# Patient Record
Sex: Female | Born: 1985 | Race: White | Hispanic: No | Marital: Single | Smoking: Current every day smoker
Health system: Southern US, Community
[De-identification: ages and names within clinical notes are randomized; demographics above are authoritative.]

## PROBLEM LIST (undated history)

## (undated) DIAGNOSIS — K7010 Alcoholic hepatitis without ascites: Secondary | ICD-10-CM

## (undated) DIAGNOSIS — F1090 Alcohol use, unspecified, uncomplicated: Secondary | ICD-10-CM

## (undated) HISTORY — DX: Alcoholic hepatitis without ascites: K70.10

## (undated) HISTORY — PX: NO PAST SURGERIES: SHX2092

---
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
## 2012-01-31 ENCOUNTER — Encounter (HOSPITAL_COMMUNITY): Payer: Self-pay | Admitting: *Deleted

## 2012-02-14 ENCOUNTER — Encounter (HOSPITAL_COMMUNITY): Admission: RE | Payer: Self-pay | Source: Ambulatory Visit

## 2012-02-14 ENCOUNTER — Ambulatory Visit (HOSPITAL_COMMUNITY): Admission: RE | Admit: 2012-02-14 | Payer: Self-pay | Source: Ambulatory Visit | Admitting: Obstetrics & Gynecology

## 2012-02-14 SURGERY — ESSURE TUBAL STERILIZATION
Anesthesia: Choice | Laterality: Bilateral

## 2017-09-13 ENCOUNTER — Emergency Department (HOSPITAL_COMMUNITY)
Admission: EM | Admit: 2017-09-13 | Discharge: 2017-09-13 | Disposition: A | Discharge status: Home / Self Care | Payer: Self-pay | Attending: Emergency Medicine | Admitting: Emergency Medicine

## 2017-09-13 ENCOUNTER — Other Ambulatory Visit: Payer: Self-pay

## 2017-09-13 ENCOUNTER — Encounter (HOSPITAL_COMMUNITY): Payer: Self-pay | Admitting: Emergency Medicine

## 2017-09-13 DIAGNOSIS — Y92008 Other place in unspecified non-institutional (private) residence as the place of occurrence of the external cause: Secondary | ICD-10-CM | POA: Insufficient documentation

## 2017-09-13 DIAGNOSIS — W108XXA Fall (on) (from) other stairs and steps, initial encounter: Secondary | ICD-10-CM | POA: Insufficient documentation

## 2017-09-13 DIAGNOSIS — Z23 Encounter for immunization: Secondary | ICD-10-CM | POA: Insufficient documentation

## 2017-09-13 DIAGNOSIS — F17210 Nicotine dependence, cigarettes, uncomplicated: Secondary | ICD-10-CM | POA: Insufficient documentation

## 2017-09-13 DIAGNOSIS — W19XXXA Unspecified fall, initial encounter: Secondary | ICD-10-CM

## 2017-09-13 DIAGNOSIS — Y998 Other external cause status: Secondary | ICD-10-CM | POA: Insufficient documentation

## 2017-09-13 DIAGNOSIS — Y9389 Activity, other specified: Secondary | ICD-10-CM | POA: Insufficient documentation

## 2017-09-13 DIAGNOSIS — S01511A Laceration without foreign body of lip, initial encounter: Secondary | ICD-10-CM | POA: Insufficient documentation

## 2017-09-13 MED ORDER — LIDOCAINE-EPINEPHRINE (PF) 2 %-1:200000 IJ SOLN
10.0000 mL | Freq: Once | INTRAMUSCULAR | Status: AC
  Administered 2017-09-13: 10 mL via INTRADERMAL
  Filled 2017-09-13: qty 20

## 2017-09-13 MED ORDER — TETANUS-DIPHTH-ACELL PERTUSSIS 5-2.5-18.5 LF-MCG/0.5 IM SUSP
0.5000 mL | Freq: Once | INTRAMUSCULAR | Status: AC
  Administered 2017-09-13: 0.5 mL via INTRAMUSCULAR
  Filled 2017-09-13: qty 0.5

## 2017-09-13 NOTE — Discharge Instructions (Addendum)
Apply triple antibiotic ointment 2-3 times a day.  Follow-up for suture removal in 3 to 4 days.  possible that he may need to see return if worsening headache, nausea, vomiting, confusion, memory loss, any new concerning symptoms to suggest a head injury.

## 2017-09-13 NOTE — ED Triage Notes (Signed)
C/o left upper lip laceration.  States she fell down approx 8 steps while kissing.  Denies LOC.  Denies neck and back pain.  Ambulatory to triage.  Reports etoh.

## 2017-09-13 NOTE — ED Provider Notes (Signed)
[facility] EMERGENCY DEPARTMENT Provider Note   CSN: [id number] Arrival date & time: 09/13/17  0505     History   Chief Complaint Chief Complaint  Patient presents with  . Lip Laceration    HPI Tanya Goodman is a 32 y.o. female.  HPI Tanya Goodman is a 32 y.o. female presents to emergency department complaining of left upper lip laceration.  Patient states kissing her boyfriend when they lost balance and both fell down approximately 8 stairs.  She states she did hit her head but denies loss of consciousness or headache.  She denies any other injuries other than hitting her mouth on the railing as she was falling.  She sustained a laceration to the left upper lip.  She states some pain to her teeth, however no loosened or broken teeth.  Denies any pain with opening and closing her mouth.  Denies any neck pain.  No numbness or weakness in extremities.  No back pain.  No chest or abdominal pain.  She is unsure when her last tetanus was.  No treatment prior to coming in.  Past Medical History:  Diagnosis Date  . SVD (spontaneous vaginal delivery)    x 3    There are no active problems to display for this patient.   Past Surgical History:  Procedure Laterality Date  . NO PAST SURGERIES       OB History   None      Home Medications    Prior to Admission medications   Not on File    Family History No family history on file.  Social History Social History   Tobacco Use  . Smoking status: Current Every Day Smoker    Packs/day: 0.25    Years: 5.00    Pack years: 1.25    Types: Cigarettes  . Smokeless tobacco: Never Used  Substance Use Topics  . Alcohol use: Yes  . Drug use: Yes    Types: Marijuana     Allergies   Patient has no allergy information on record.   Review of Systems Review of Systems  Constitutional: Negative for chills and fever.  HENT: Positive for facial swelling. Negative for dental problem.   Respiratory:  Negative for cough, chest tightness and shortness of breath.   Cardiovascular: Negative for chest pain, palpitations and leg swelling.  Musculoskeletal: Negative for arthralgias, myalgias, neck pain and neck stiffness.  Skin: Positive for wound. Negative for rash.  Neurological: Negative for dizziness, weakness and headaches.  All other systems reviewed and are negative.    Physical Exam Updated Vital Signs BP 126/84 (BP Location: Right Arm)   Pulse 88   Temp 99.1 F (37.3 C) (Oral)   Resp 14   LMP 08/13/2017   SpO2 100%   Physical Exam  Constitutional: She is oriented to person, place, and time. She appears well-developed and well-nourished. No distress.  HENT:  Head: Normocephalic.  Left upper lip laceration through vermilion border, irregular, with some missing tissue. Teeth normal, with no fractures or loosening. No TTP over maxilla or mandible.   Eyes: Pupils are equal, round, and reactive to light. Conjunctivae and EOM are normal.  Neck: Neck supple.  Cardiovascular: Normal rate, regular rhythm and normal heart sounds.  Pulmonary/Chest: Effort normal and breath sounds normal. No respiratory distress. She has no wheezes. She has no rales.  Musculoskeletal: She exhibits no edema.  Neurological: She is alert and oriented to person, place, and time.  Skin: Skin is warm  and dry.  Psychiatric: She has a normal mood and affect. Her behavior is normal.  Nursing note and vitals reviewed.    ED Treatments / Results  Labs (all labs ordered are listed, but only abnormal results are displayed) Labs Reviewed - No data to display  EKG None  Radiology No results found.  Procedures .Marland KitchenLaceration Repair Date/Time: 09/13/2017 8:41 AM Performed by: Jaynie Crumble, PA-C Authorized by: Jaynie Crumble, PA-C   Consent:    Consent obtained:  Verbal   Consent given by:  Patient   Risks discussed:  Infection, pain, poor cosmetic result, need for additional repair, nerve  damage and poor wound healing   Alternatives discussed:  No treatment Anesthesia (see MAR for exact dosages):    Anesthesia method:  Nerve block   Block location:  Superior alveolar   Block needle gauge:  25 G   Block anesthetic:  Lidocaine 2% WITH epi   Block injection procedure:  Anatomic landmarks identified, introduced needle, negative aspiration for blood and anatomic landmarks palpated   Block outcome:  Anesthesia achieved Laceration details:    Location:  Lip   Lip location:  Upper lip, full thickness   Vermilion border involved: yes     Length (cm):  2 Repair type:    Repair type:  Intermediate Pre-procedure details:    Preparation:  Patient was prepped and draped in usual sterile fashion Treatment:    Area cleansed with:  Saline and Betadine   Amount of cleaning:  Standard   Irrigation solution:  Sterile saline   Irrigation method:  Syringe Mucous membrane repair:    Suture size:  6-0   Suture material:  Fast-absorbing gut   Suture technique:  Simple interrupted   Number of sutures:  3 Skin repair:    Repair method:  Sutures   Suture size:  6-0   Suture material:  Prolene   Suture technique:  Simple interrupted   Number of sutures:  4 Approximation:    Approximation:  Close   Vermilion border: well-aligned   Post-procedure details:    Patient tolerance of procedure:  Tolerated well, no immediate complications   (including critical care time)  Medications Ordered in ED Medications  lidocaine-EPINEPHrine (XYLOCAINE W/EPI) 2 %-1:200000 (PF) injection 10 mL (has no administration in time range)  Tdap (BOOSTRIX) injection 0.5 mL (has no administration in time range)     Initial Impression / Assessment and Plan / ED Course  I have reviewed the triage vital signs and the nursing notes.  Pertinent labs & imaging results that were available during my care of the patient were reviewed by me and considered in my medical decision making (see chart for details).      Patient with a head injury after falling down  8 steps.  Incident happened yesterday evening.  Exam with no concerning findings other than laceration of the lip.  Specifically no loss of consciousness, no nausea or vomiting, dizziness no confusion, I do not think patient needs any imaging of her head.  She is not anticoagulated.  Teeth are intact.  No facial bony tenderness.  Laceration repaired with sutures.  Laceration is complex, it is irregular, through vermilion border, with some tissue missing.  I was able to pull the skin together and alignment of to best of my ability.  We discussed that patient may need to follow-up with plastic surgery in the future if it scars badly.  For now bacitracin, follow-up in 3 to 5 days for suture removal.  Return precautions discussed.  Vitals:   09/13/17 0513 09/13/17 0647  BP: 130/85 126/84  Pulse: (!) 102 88  Resp: 18 14  Temp: 99.1 F (37.3 C)   TempSrc: Oral   SpO2: 98% 100%    Final Clinical Impressions(s) / ED Diagnoses   Final diagnoses:  Lip laceration, initial encounter  Fall, initial encounter    ED Discharge Orders    None       Jaynie CrumbleKirichenko, , PA-C 09/13/17 0845    Gwyneth SproutPlunkett, Whitney, MD 09/14/17 1544

## 2017-09-16 ENCOUNTER — Encounter (HOSPITAL_COMMUNITY): Payer: Self-pay | Admitting: *Deleted

## 2017-09-16 ENCOUNTER — Emergency Department (HOSPITAL_COMMUNITY)
Admission: EM | Admit: 2017-09-16 | Discharge: 2017-09-16 | Disposition: A | Discharge status: Home / Self Care | Payer: Self-pay | Attending: Emergency Medicine | Admitting: Emergency Medicine

## 2017-09-16 ENCOUNTER — Other Ambulatory Visit: Payer: Self-pay

## 2017-09-16 DIAGNOSIS — Z4802 Encounter for removal of sutures: Secondary | ICD-10-CM | POA: Insufficient documentation

## 2017-09-16 DIAGNOSIS — F17210 Nicotine dependence, cigarettes, uncomplicated: Secondary | ICD-10-CM | POA: Insufficient documentation

## 2017-09-16 NOTE — ED Triage Notes (Signed)
Pt in for suture removal, placed on Saturday morning

## 2017-09-16 NOTE — ED Provider Notes (Signed)
[facility] EMERGENCY DEPARTMENT Provider Note   CSN: [id number] Arrival date & time: 09/16/17  1221     History   Chief Complaint Chief Complaint  Patient presents with  . Suture / Staple Removal    HPI Tanya Goodman is a 32 y.o. female.  HPI   32 year old female presents today for suture removal.  Laceration on 09/13/2017 that was repaired here in the emergency room.  She had 3 fast-absorbing sutures placed on the mucous membrane followed by four 6-0 Prolene's on the upper lip.  She notes normal healing with no complications.  No other complaints.  Past Medical History:  Diagnosis Date  . SVD (spontaneous vaginal delivery)    x 3    There are no active problems to display for this patient.   Past Surgical History:  Procedure Laterality Date  . NO PAST SURGERIES       OB History   None      Home Medications    Prior to Admission medications   Not on File    Family History History reviewed. No pertinent family history.  Social History Social History   Tobacco Use  . Smoking status: Current Every Day Smoker    Packs/day: 0.25    Years: 5.00    Pack years: 1.25    Types: Cigarettes  . Smokeless tobacco: Never Used  Substance Use Topics  . Alcohol use: Yes  . Drug use: Yes    Types: Marijuana     Allergies   Patient has no known allergies.   Review of Systems Review of Systems  All other systems reviewed and are negative.    Physical Exam Updated Vital Signs BP (!) 164/98 (BP Location: Right Arm)   Pulse 99   Temp 98.4 F (36.9 C) (Oral)   Resp 18   Ht 5\' 4"  (1.626 m)   Wt 86.2 kg (190 lb)   SpO2 99%   BMI 32.61 kg/m   Physical Exam  Constitutional: She is oriented to person, place, and time. She appears well-developed and well-nourished.  HENT:  Head: Normocephalic.  Well-healing laceration to the left upper lip, dissolvable stitches still in place, 4 Prolene in place-no significant edema or discharge  noted, no surrounding redness  Eyes: Pupils are equal, round, and reactive to light. Conjunctivae are normal. Right eye exhibits no discharge. Left eye exhibits no discharge. No scleral icterus.  Neck: Normal range of motion. No JVD present. No tracheal deviation present.  Pulmonary/Chest: Effort normal. No stridor.  Neurological: She is alert and oriented to person, place, and time. Coordination normal.  Psychiatric: She has a normal mood and affect. Her behavior is normal. Judgment and thought content normal.  Nursing note and vitals reviewed.    ED Treatments / Results  Labs (all labs ordered are listed, but only abnormal results are displayed) Labs Reviewed - No data to display  EKG None  Radiology No results found.  Procedures Procedures (including critical care time)  Medications Ordered in ED Medications - No data to display   Initial Impression / Assessment and Plan / ED Course  I have reviewed the triage vital signs and the nursing notes.  Pertinent labs & imaging results that were available during my care of the patient were reviewed by me and considered in my medical decision making (see chart for details).     Labs:   Imaging:  Consults:  Therapeutics:  Discharge Meds:   Assessment/Plan: 32 year old female presents today for  wound assessment and suture removal.  She did have 2 sutures that were amenable to removal at this time.  The lower 2 stitches in her lip would most likely be okay for removal at this time but with benefit from additionalneed  2 to 3 days.  Patient need these removed in 2-3 days.,  She is given strict return precautions, she verbalized understanding and agreement to today's plan.    Final Clinical Impressions(s) / ED Diagnoses   Final diagnoses:  Visit for suture removal    ED Discharge Orders    None       Rosalio LoudHedges, , PA-C 09/16/17 1332    Tilden Fossaees, Elizabeth, MD 09/17/17 1331

## 2017-09-16 NOTE — Discharge Instructions (Addendum)
Please read attached information. If you experience any new or worsening signs or symptoms please return to the emergency room for evaluation. Please follow-up with your primary care provider or specialist as discussed.  °

## 2017-09-16 NOTE — ED Notes (Signed)
Patient verbalized understanding of discharge instructions and denies any further needs or questions at this time. VS stable. Patient ambulatory with steady gait.  

## 2022-07-09 ENCOUNTER — Encounter (HOSPITAL_COMMUNITY): Payer: Self-pay | Admitting: Behavioral Health

## 2022-07-09 ENCOUNTER — Other Ambulatory Visit (HOSPITAL_COMMUNITY)
Admission: EM | Admit: 2022-07-09 | Discharge: 2022-07-13 | Disposition: A | Discharge status: Home / Self Care | Payer: No Payment, Other | Attending: Psychiatry | Admitting: Psychiatry

## 2022-07-09 DIAGNOSIS — F1090 Alcohol use, unspecified, uncomplicated: Secondary | ICD-10-CM | POA: Diagnosis present

## 2022-07-09 DIAGNOSIS — F419 Anxiety disorder, unspecified: Secondary | ICD-10-CM | POA: Diagnosis not present

## 2022-07-09 DIAGNOSIS — Z1152 Encounter for screening for COVID-19: Secondary | ICD-10-CM | POA: Insufficient documentation

## 2022-07-09 LAB — CBC WITH DIFFERENTIAL/PLATELET
Abs Immature Granulocytes: 0.01 10*3/uL (ref 0.00–0.07)
Basophils Absolute: 0 10*3/uL (ref 0.0–0.1)
Basophils Relative: 1 %
Eosinophils Absolute: 0 10*3/uL (ref 0.0–0.5)
Eosinophils Relative: 1 %
HCT: 41 % (ref 36.0–46.0)
Hemoglobin: 14.1 g/dL (ref 12.0–15.0)
Immature Granulocytes: 0 %
Lymphocytes Relative: 51 %
Lymphs Abs: 2.3 10*3/uL (ref 0.7–4.0)
MCH: 35 pg — ABNORMAL HIGH (ref 26.0–34.0)
MCHC: 34.4 g/dL (ref 30.0–36.0)
MCV: 101.7 fL — ABNORMAL HIGH (ref 80.0–100.0)
Monocytes Absolute: 0.4 10*3/uL (ref 0.1–1.0)
Monocytes Relative: 9 %
Neutro Abs: 1.7 10*3/uL (ref 1.7–7.7)
Neutrophils Relative %: 38 %
Platelets: 213 10*3/uL (ref 150–400)
RBC: 4.03 MIL/uL (ref 3.87–5.11)
RDW: 12.2 % (ref 11.5–15.5)
WBC: 4.5 10*3/uL (ref 4.0–10.5)
nRBC: 0 % (ref 0.0–0.2)

## 2022-07-09 LAB — LIPID PANEL
Cholesterol: 328 mg/dL — ABNORMAL HIGH (ref 0–200)
HDL: >135 mg/dL (ref >40)
Triglycerides: 191 mg/dL — ABNORMAL HIGH (ref <150)
VLDL: 38 mg/dL (ref 0–40)

## 2022-07-09 LAB — TSH: TSH: 1.581 u[IU]/mL (ref 0.350–4.500)

## 2022-07-09 LAB — POC URINE PREG, ED: Preg Test, Ur: NEGATIVE

## 2022-07-09 LAB — POCT URINE DRUG SCREEN - MANUAL ENTRY (I-SCREEN)
POC Amphetamine UR: NOT DETECTED
POC Buprenorphine (BUP): NOT DETECTED
POC Cocaine UR: NOT DETECTED
POC Marijuana UR: POSITIVE — AB
POC Methadone UR: NOT DETECTED
POC Methamphetamine UR: NOT DETECTED
POC Morphine: NOT DETECTED
POC Oxazepam (BZO): POSITIVE — AB
POC Oxycodone UR: NOT DETECTED
POC Secobarbital (BAR): NOT DETECTED

## 2022-07-09 LAB — HEMOGLOBIN A1C
Hgb A1c MFr Bld: 5.2 % (ref 4.8–5.6)
Mean Plasma Glucose: 102.54 mg/dL

## 2022-07-09 LAB — COMPREHENSIVE METABOLIC PANEL
ALT: 177 U/L — ABNORMAL HIGH (ref 0–44)
AST: 342 U/L — ABNORMAL HIGH (ref 15–41)
Albumin: 3.9 g/dL (ref 3.5–5.0)
Alkaline Phosphatase: 92 U/L (ref 38–126)
Anion gap: 20 — ABNORMAL HIGH (ref 5–15)
BUN: 12 mg/dL (ref 6–20)
CO2: 18 mmol/L — ABNORMAL LOW (ref 22–32)
Calcium: 9.6 mg/dL (ref 8.9–10.3)
Chloride: 100 mmol/L (ref 98–111)
Creatinine, Ser: 0.57 mg/dL (ref 0.44–1.00)
GFR, Estimated: >60 mL/min (ref >60)
Glucose, Bld: 199 mg/dL — ABNORMAL HIGH (ref 70–99)
Potassium: 3.7 mmol/L (ref 3.5–5.1)
Sodium: 138 mmol/L (ref 135–145)
Total Bilirubin: 0.7 mg/dL (ref 0.3–1.2)
Total Protein: 6.9 g/dL (ref 6.5–8.1)

## 2022-07-09 LAB — MAGNESIUM: Magnesium: 1.9 mg/dL (ref 1.7–2.4)

## 2022-07-09 LAB — POC SARS CORONAVIRUS 2 AG: SARSCOV2ONAVIRUS 2 AG: NEGATIVE

## 2022-07-09 LAB — ETHANOL: Alcohol, Ethyl (B): 268 mg/dL — ABNORMAL HIGH (ref <10)

## 2022-07-09 LAB — POCT PREGNANCY, URINE: Preg Test, Ur: NEGATIVE

## 2022-07-09 LAB — SARS CORONAVIRUS 2 BY RT PCR: SARS Coronavirus 2 by RT PCR: NEGATIVE

## 2022-07-09 MED ORDER — ACETAMINOPHEN 325 MG PO TABS: 650.0000 mg | ORAL_TABLET | Freq: Four times a day (QID) | ORAL | Status: DC | PRN

## 2022-07-09 MED ORDER — ADULT MULTIVITAMIN W/MINERALS CH
1.0000 | ORAL_TABLET | Freq: Every day | ORAL | Status: DC
  Administered 2022-07-09 – 2022-07-13 (×5): 1 via ORAL
  Filled 2022-07-09 (×5): qty 1

## 2022-07-09 MED ORDER — ONDANSETRON 4 MG PO TBDP
4.0000 mg | ORAL_TABLET | Freq: Four times a day (QID) | ORAL | Status: AC | PRN
  Administered 2022-07-09: 4 mg via ORAL
  Filled 2022-07-09: qty 1

## 2022-07-09 MED ORDER — LOPERAMIDE HCL 2 MG PO CAPS: 2.0000 mg | ORAL_CAPSULE | ORAL | Status: AC | PRN

## 2022-07-09 MED ORDER — TRAZODONE HCL 50 MG PO TABS
50.0000 mg | ORAL_TABLET | Freq: Every evening | ORAL | Status: DC | PRN
  Administered 2022-07-09 – 2022-07-10 (×2): 50 mg via ORAL
  Filled 2022-07-09 (×2): qty 1

## 2022-07-09 MED ORDER — LORAZEPAM 1 MG PO TABS
1.0000 mg | ORAL_TABLET | Freq: Four times a day (QID) | ORAL | Status: AC | PRN
  Administered 2022-07-09: 1 mg via ORAL
  Filled 2022-07-09: qty 1

## 2022-07-09 MED ORDER — LORAZEPAM 1 MG PO TABS
1.0000 mg | ORAL_TABLET | Freq: Three times a day (TID) | ORAL | Status: AC
  Administered 2022-07-11 – 2022-07-12 (×3): 1 mg via ORAL
  Filled 2022-07-09 (×3): qty 1

## 2022-07-09 MED ORDER — MAGNESIUM HYDROXIDE 400 MG/5ML PO SUSP: 30.0000 mL | Freq: Every day | ORAL | Status: DC | PRN

## 2022-07-09 MED ORDER — THIAMINE MONONITRATE 100 MG PO TABS
100.0000 mg | ORAL_TABLET | Freq: Every day | ORAL | Status: DC
  Administered 2022-07-10 – 2022-07-13 (×4): 100 mg via ORAL
  Filled 2022-07-09 (×4): qty 1

## 2022-07-09 MED ORDER — HYDROXYZINE HCL 25 MG PO TABS
25.0000 mg | ORAL_TABLET | Freq: Four times a day (QID) | ORAL | Status: AC | PRN
  Administered 2022-07-11 – 2022-07-12 (×2): 25 mg via ORAL
  Filled 2022-07-09: qty 1
  Filled 2022-07-09: qty 10
  Filled 2022-07-09: qty 1

## 2022-07-09 MED ORDER — THIAMINE HCL 100 MG/ML IJ SOLN
100.0000 mg | Freq: Once | INTRAMUSCULAR | Status: AC
  Administered 2022-07-09: 100 mg via INTRAMUSCULAR
  Filled 2022-07-09: qty 2

## 2022-07-09 MED ORDER — LORAZEPAM 1 MG PO TABS
1.0000 mg | ORAL_TABLET | Freq: Two times a day (BID) | ORAL | Status: AC
  Administered 2022-07-12 – 2022-07-13 (×2): 1 mg via ORAL
  Filled 2022-07-09 (×2): qty 1

## 2022-07-09 MED ORDER — ALUM & MAG HYDROXIDE-SIMETH 200-200-20 MG/5ML PO SUSP: 30.0000 mL | ORAL | Status: DC | PRN

## 2022-07-09 MED ORDER — LORAZEPAM 1 MG PO TABS: 1.0000 mg | ORAL_TABLET | Freq: Every day | ORAL | Status: DC

## 2022-07-09 MED ORDER — LORAZEPAM 1 MG PO TABS
1.0000 mg | ORAL_TABLET | Freq: Four times a day (QID) | ORAL | Status: AC
  Administered 2022-07-09 – 2022-07-11 (×6): 1 mg via ORAL
  Filled 2022-07-09 (×6): qty 1

## 2022-07-09 NOTE — ED Provider Notes (Signed)
Facility Based Crisis Admission H&P  Date: 07/09/22 Patient Name: Tanya Goodman MRN: [id number] Chief Complaint: "I have developed alcoholism"  Diagnoses:  Final diagnoses:  Alcohol use disorder   HPI: Tanya Chuteiffany R Freehling is a 37 y.o. female patient with a reported past psychiatric history of anxiety who presented voluntarily and accompanied by her mother Lance CoonGeorgette and daughter Fredric MareBailey to [facility] for a walk-in assessment seeking alcohol detox. Patient requested for her mother and daughter to remain present during assessment.  Patient assessed face-to-face by this provider and chart reviewed on 07/09/22. On evaluation, Glade Stanfordiffany R Jeralyn Bennettutnam is seated in assessment area in no acute distress. Patient is alert and oriented x4, cooperative and pleasant during assessment. Patient appears intoxicated. Speech is coherent, slurred at times, normal rate and volume. Patient appears disheveled and has an odor of alcohol. Eye contact is fair. Mood is depressed and anxious with congruent affect, tearful at times during assessment. Thought process is coherent with logical thought content. Patient denies current suicidal and homicidal ideations and verbally contracts for safety with this Clinical research associatewriter at this time. Patient reports that she experiences passive suicidal ideations with no plan or intent "sometimes, they are fleeting moments." Patient reports last experiencing passive suicidal ideations today. Patient denies a history of suicide attempts or self-harm. Patient denies past inpatient psychiatric hospitalizations or substance use detox/treatment programs. Patient denies current auditory and visual hallucinations but states she "hears my families voices when I'm sober." Patient denies symptoms of paranoia. Patient is able to converse coherently with goal-directed thoughts and no distractibility or preoccupation. Objectively, there is no evidence of psychosis/mania, delusional thinking, or indication that patient is responding  to internal or external stimuli.   Patient endorses increased sleep and decreased appetite related to her alcohol use. Daughter states patient "sleeps all the time." Patient states [place], that her boyfriend just moved out, and that her daughter stays with her occasionally. Patient denies access to weapons. 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 Patient denies having current outpatient psychiatric services for therapy or medication management in place. Patient denies currently taking any medications. Patient endorses alcohol use that became "regular" 14 years ago. Patient reports drinking 2 "big bottles" of wine and "bootleggers" daily. Patient reports occasionally drinking vodka in addition to the wine and bootleggers. Patient's mother states, "I haven't seen her sober in months." Patient reports her last drink was 3 hours prior to [facility]. Patient endorses marijuana use 1-2x/week. Patient endorses Xanax use that she "gets from my nurse friend." Patient estimates that she uses 1/4 bar of Xanax approximately 3x/week. Patient denies use of other illicit substances. Patient denies a history of seizures or DTs. Patient reports current withdrawal symptoms of nausea. Patient states that she also has experienced hand tremors when not using alcohol. Patient is requesting detox and residential substance use treatment.  Patient offered support and encouragement. Discussed admission to the facility base crisis unit for alcohol use disorder, detox, crisis management, and stabilization. Patient is in agreement with plan of care.  PHQ 2-9:  Flowsheet Row [facility]  Thoughts that you would be better off dead, or of hurting yourself in some way Several days  PHQ-9 Total Score 20       Flowsheet Row ED [facility]  C-SSRS RISK CATEGORY No  Risk        Total Time spent with patient: 45 minutes  Musculoskeletal  Strength & Muscle Tone: within normal limits Gait & Station: normal Patient leans: N/A  Psychiatric Specialty Exam  Presentation General Appearance:  Appropriate for Environment; Casual; Disheveled  Eye Contact: Fair  Speech: Normal Rate; Slurred (Coherent, slurred at times)  Speech Volume: Normal  Handedness: Right   Mood and Affect  Mood: Depressed; Anxious  Affect: Congruent; Depressed; Tearful   Thought Process  Thought Processes: Coherent; Goal Directed  Descriptions of Associations:Intact  Orientation:Full (Time, Place and Person)  Thought Content:Logical    Hallucinations:Hallucinations: None (Patient reports hearing her families voices when she is sober)  Ideas of Reference:None  Suicidal Thoughts:Suicidal Thoughts: No (Passive SI today)  Homicidal Thoughts:Homicidal Thoughts: No   Sensorium  Memory: Immediate Fair; Recent Fair; Remote Fair  Judgment: Impaired  Insight: Poor; Lacking   Executive Functions  Concentration: Fair  Attention Span: Fair  Recall: [employer] of Knowledge: Fair  Language: Fair   Psychomotor Activity  Psychomotor Activity: Psychomotor Activity: Normal   Assets  Assets: Communication Skills; Desire for Improvement; Financial Resources/Insurance; Housing; Physical Health; Resilience; Social Support; Transportation   Sleep  Sleep: Sleep: Good Number of Hours of Sleep: 10   Nutritional Assessment (For OBS and FBC admissions only) Has the patient had a weight loss or gain of 10 pounds or more in the last 3 months?: No Has the patient had a decrease in food intake/or appetite?: No Does the patient have dental problems?: No Does the patient have eating habits or behaviors that may be indicators of an eating disorder including binging or inducing vomiting?: No Has the  patient recently lost weight without trying?: 0 Has the patient been eating poorly because of a decreased appetite?: 0 Malnutrition Screening Tool Score: 0    Physical Exam Vitals and nursing note reviewed.  Constitutional:      General: She is not in acute distress.    Appearance: Normal appearance. She is not ill-appearing.  HENT:     Head: Normocephalic and atraumatic.     Nose: Nose normal.  Eyes:     General:        Right eye: No discharge.        Left eye: No discharge.     Conjunctiva/sclera: Conjunctivae normal.  Cardiovascular:     Rate and Rhythm: Tachycardia present.  Pulmonary:     Effort: Pulmonary effort is normal. No respiratory distress.  Musculoskeletal:        General: Normal range of motion.     Cervical back: Normal range of motion.  Skin:    General: Skin is warm and dry.  Neurological:     General: No focal deficit present.     Mental Status: She is alert and oriented to person, place, and time. Mental status is at baseline.  Psychiatric:        Attention and Perception: Attention and perception normal.        Mood and Affect: Mood is anxious and depressed. Affect is tearful.        Speech: Speech is slurred.        Behavior: Behavior normal. Behavior is cooperative.        Thought Content: Thought content normal.        Cognition and Memory: Cognition and memory normal.     Comments: Speech coherent, slurred at times. Judgment impaired.    Review of Systems  Constitutional: Negative.  HENT: Negative.    Eyes: Negative.   Respiratory: Negative.    Cardiovascular: Negative.   Gastrointestinal: Negative.   Genitourinary: Negative.   Musculoskeletal: Negative.   Skin: Negative.   Neurological: Negative.   Endo/Heme/Allergies: Negative.   Psychiatric/Behavioral:  Positive for depression and substance abuse. Negative for hallucinations, memory loss and suicidal ideas. The patient is nervous/anxious. The patient does not have insomnia.      Blood pressure (!) 123/93, pulse (!) 126, temperature 99.1 F (37.3 C), temperature source Oral, resp. rate 20, SpO2 100 %. There is no height or weight on file to calculate BMI.  Past Psychiatric History: Anxiety  Is the patient at risk to self? No  Has the patient been a risk to self in the past 6 months? No .    Has the patient been a risk to self within the distant past? No   Is the patient a risk to others? No   Has the patient been a risk to others in the past 6 months? No   Has the patient been a risk to others within the distant past? No   Past Medical History:  Past Medical History:  Diagnosis Date   SVD (spontaneous vaginal delivery)    x 3   Family History: None reported  Social History:  Social History   Tobacco Use   Smoking status: Every Day    Packs/day: 0.25    Years: 5.00    Additional pack years: 0.00    Total pack years: 1.25    Types: Cigarettes   Smokeless tobacco: Never  Substance Use Topics   Alcohol use: Yes   Drug use: Yes    Types: Marijuana    Last Labs:  Admission on 07/09/2022  Component Date Value Ref Range Status   WBC 07/09/2022 4.5  4.0 - 10.5 K/uL Final   RBC 07/09/2022 4.03  3.87 - 5.11 MIL/uL Final   Hemoglobin 07/09/2022 14.1  12.0 - 15.0 g/dL Final   HCT 16/12/9602 41.0  36.0 - 46.0 % Final   MCV 07/09/2022 101.7 (H)  80.0 - 100.0 fL Final   MCH 07/09/2022 35.0 (H)  26.0 - 34.0 pg Final   MCHC 07/09/2022 34.4  30.0 - 36.0 g/dL Final   RDW 54/11/8117 12.2  11.5 - 15.5 % Final   Platelets 07/09/2022 213  150 - 400 K/uL Final   nRBC 07/09/2022 0.0  0.0 - 0.2 % Final   Neutrophils Relative % 07/09/2022 38  % Final   Neutro Abs 07/09/2022 1.7  1.7 - 7.7 K/uL Final   Lymphocytes Relative 07/09/2022 51  % Final   Lymphs Abs 07/09/2022 2.3  0.7 - 4.0 K/uL Final   Monocytes Relative 07/09/2022 9  % Final   Monocytes Absolute 07/09/2022 0.4  0.1 - 1.0 K/uL Final   Eosinophils Relative 07/09/2022 1  % Final   Eosinophils  Absolute 07/09/2022 0.0  0.0 - 0.5 K/uL Final   Basophils Relative 07/09/2022 1  % Final   Basophils Absolute 07/09/2022 0.0  0.0 - 0.1 K/uL Final   Immature Granulocytes 07/09/2022 0  % Final   Abs Immature Granulocytes 07/09/2022 0.01  0.00 - 0.07 K/uL Final   Performed at [facility], [address] [place], [place] [address]   Sodium 07/09/2022 138  135 - 145 mmol/L Final   Potassium 07/09/2022 3.7  3.5 - 5.1 mmol/L Final   Chloride 07/09/2022 100  98 - 111 mmol/L Final   CO2 07/09/2022 18 (  L)  22 - 32 mmol/L Final   Glucose, Bld 07/09/2022 199 (H)  70 - 99 mg/dL Final   Glucose reference range applies only to samples taken after fasting for at least 8 hours.   BUN 07/09/2022 12  6 - 20 mg/dL Final   Creatinine, Ser 07/09/2022 0.57  0.44 - 1.00 mg/dL Final   Calcium 96/07/5407 9.6  8.9 - 10.3 mg/dL Final   Total Protein 81/19/1478 6.9  6.5 - 8.1 g/dL Final   Albumin 29/56/2130 3.9  3.5 - 5.0 g/dL Final   AST 86/57/8469 342 (H)  15 - 41 U/L Final   ALT 07/09/2022 177 (H)  0 - 44 U/L Final   Alkaline Phosphatase 07/09/2022 92  38 - 126 U/L Final   Total Bilirubin 07/09/2022 0.7  0.3 - 1.2 mg/dL Final   GFR, Estimated 07/09/2022 >60  >60 mL/min Final   Comment: (NOTE) Calculated using the CKD-EPI Creatinine Equation (2021)    Anion gap 07/09/2022 20 (H)  5 - 15 Final   Comment: ELECTROLYTES REPEATED TO VERIFY Performed at [facility], [address], [place], [place] [address]    Hgb A1c MFr Bld 07/09/2022 5.2  4.8 - 5.6 % Final   Comment: (NOTE) Pre diabetes:          5.7%-6.4%  Diabetes:              >6.4%  Glycemic control for   <7.0% adults with diabetes    Mean Plasma Glucose 07/09/2022 102.54  mg/dL Final   Performed at [facility], [address] [place], [place] [address]   Magnesium 07/09/2022 1.9  1.7 - 2.4 mg/dL Final   Performed at [facility], [address], [place], [place] [address]   Alcohol, Ethyl (B) 07/09/2022 268 (H)  <10  mg/dL Final   Comment: (NOTE) Lowest detectable limit for serum alcohol is 10 mg/dL.  For medical purposes only. Performed at [facility], [address] [place], [place] [address]    Cholesterol 07/09/2022 328 (H)  0 - 200 mg/dL Final   Triglycerides 66/44/0347 191 (H)  <150 mg/dL Final   HDL 42/59/5638 >135  >40 mg/dL Final   Total CHOL/HDL Ratio 07/09/2022 NOT CALCULATED  RATIO Final   VLDL 07/09/2022 38  0 - 40 mg/dL Final   LDL Cholesterol 07/09/2022 NOT CALCULATED  0 - 99 mg/dL Final   Performed at [facility], [address] [place], [place] [address]   TSH 07/09/2022 1.581  0.350 - 4.500 uIU/mL Final   Comment: Performed by a 3rd Generation assay with a functional sensitivity of <=0.01 uIU/mL. Performed at [facility], [address], [place], [place] [address]    Preg Test, Ur 07/09/2022 Negative  Negative Final   POC Amphetamine UR 07/09/2022 None Detected  NONE DETECTED (Cut Off Level 1000 ng/mL) Final   POC Secobarbital (BAR) 07/09/2022 None Detected  NONE DETECTED (Cut Off Level 300 ng/mL) Final   POC Buprenorphine (BUP) 07/09/2022 None Detected  NONE DETECTED (Cut Off Level 10 ng/mL) Final   POC Oxazepam (BZO) 07/09/2022 Positive (A)  NONE DETECTED (Cut Off Level 300 ng/mL) Final   POC Cocaine UR 07/09/2022 None Detected  NONE DETECTED (Cut Off Level 300 ng/mL) Final   POC Methamphetamine UR 07/09/2022 None Detected  NONE DETECTED (Cut Off Level 1000 ng/mL) Final   POC Morphine 07/09/2022 None Detected  NONE DETECTED (Cut Off Level 300 ng/mL) Final  POC Methadone UR 07/09/2022 None Detected  NONE DETECTED (Cut Off Level 300 ng/mL) Final   POC Oxycodone UR 07/09/2022 None Detected  NONE DETECTED (Cut Off Level 100 ng/mL) Final   POC Marijuana UR 07/09/2022 Positive (A)  NONE DETECTED (Cut Off Level 50 ng/mL) Final   SARSCOV2ONAVIRUS 2 AG 07/09/2022 NEGATIVE  NEGATIVE Final   Comment: (NOTE) SARS-CoV-2 antigen NOT DETECTED.   Negative results are  presumptive.  Negative results do not preclude SARS-CoV-2 infection and should not be used as the sole basis for treatment or other patient management decisions, including infection  control decisions, particularly in the presence of clinical signs and  symptoms consistent with COVID-19, or in those who have been in contact with the virus.  Negative results must be combined with clinical observations, patient history, and epidemiological information. The expected result is Negative.  Fact Sheet for Patients: https://www.jennings-kim.com/  Fact Sheet for Healthcare Providers: https://alexander-rogers.biz/  This test is not yet approved or cleared by the [place] FDA and  has been authorized for detection and/or diagnosis of SARS-CoV-2 by FDA under an Emergency Use Authorization (EUA).  This EUA will remain in effect (meaning this test can be used) for the duration of  the COV                          ID-19 declaration under Section 564(b)(1) of the Act, 21 U.S.C. section 360bbb-3(b)(1), unless the authorization is terminated or revoked sooner.     Preg Test, Ur 07/09/2022 NEGATIVE  NEGATIVE Final   Comment:        THE SENSITIVITY OF THIS METHODOLOGY IS >24 mIU/mL     Allergies: Patient has no known allergies.  Medications:  Facility Ordered Medications  Medication   acetaminophen (TYLENOL) tablet 650 mg   alum & mag hydroxide-simeth (MAALOX/MYLANTA) 200-200-20 MG/5ML suspension 30 mL   magnesium hydroxide (MILK OF MAGNESIA) suspension 30 mL   traZODone (DESYREL) tablet 50 mg   [COMPLETED] thiamine (VITAMIN B1) injection 100 mg   [START ON 07/10/2022] thiamine (VITAMIN B1) tablet 100 mg   multivitamin with minerals tablet 1 tablet   LORazepam (ATIVAN) tablet 1 mg   hydrOXYzine (ATARAX) tablet 25 mg   loperamide (IMODIUM) capsule 2-4 mg   ondansetron (ZOFRAN-ODT) disintegrating tablet 4 mg   LORazepam (ATIVAN) tablet 1 mg   Followed by    Melene Muller ON 07/11/2022] LORazepam (ATIVAN) tablet 1 mg   Followed by   Melene Muller ON 07/12/2022] LORazepam (ATIVAN) tablet 1 mg   Followed by   Melene Muller ON 07/14/2022] LORazepam (ATIVAN) tablet 1 mg    Long Term Goals: Improvement in symptoms so as ready for discharge  Short Term Goals: Patient will verbalize feelings in meetings with treatment team members., Patient will attend at least of 50% of the groups daily., Pt will complete the PHQ9 on admission, day 3 and discharge., Patient will participate in completing the [place] Suicide Severity Rating Scale, Patient will score a low risk of violence for 24 hours prior to discharge, and Patient will take medications as prescribed daily.  Medical Decision Making  SANTRICE GRIGNON was admitted to [facility] base crisis unit under the service of Nelly Rout, MD for Alcohol use disorder, crisis management, and stabilization. Routine labs ordered, which include Lab Orders         SARS Coronavirus 2 by RT PCR (hospital order, performed in [facility] hospital lab) *cepheid single result test*  CBC with Differential/Platelet         Comprehensive metabolic panel         Hemoglobin A1c         Magnesium         Ethanol         Lipid panel         TSH         Prolactin         POC urine preg, ED         POCT Urine Drug Screen - (I-Screen)         POC SARS Coronavirus 2 Ag         Pregnancy, urine POC    Medication Management: Medications started Meds ordered this encounter  Medications   acetaminophen (TYLENOL) tablet 650 mg   alum & mag hydroxide-simeth (MAALOX/MYLANTA) 200-200-20 MG/5ML suspension 30 mL   magnesium hydroxide (MILK OF MAGNESIA) suspension 30 mL   traZODone (DESYREL) tablet 50 mg   thiamine (VITAMIN B1) injection 100 mg   thiamine (VITAMIN B1) tablet 100 mg   multivitamin with minerals tablet 1 tablet   LORazepam (ATIVAN) tablet 1 mg   hydrOXYzine (ATARAX) tablet 25 mg   loperamide  (IMODIUM) capsule 2-4 mg   ondansetron (ZOFRAN-ODT) disintegrating tablet 4 mg   FOLLOWED BY Linked Order Group    LORazepam (ATIVAN) tablet 1 mg    LORazepam (ATIVAN) tablet 1 mg    LORazepam (ATIVAN) tablet 1 mg    LORazepam (ATIVAN) tablet 1 mg    Will maintain observation checks every 15 minutes for safety.   Recommendations  Based on my evaluation the patient does not appear to have an emergency medical condition.  Recommend admission to the facility base crisis unit for alcohol use disorder, detox, crisis management, and stabilization.  Sunday Corn, NP 07/09/22  10:49 PM

## 2022-07-09 NOTE — Progress Notes (Signed)
   07/09/22 1840  [facility] Triage Screening (Walk-ins at [facility] only)  How Did You Hear About [place]? Family/Friend  What Is the Reason for Your Visit/Call Today? Pt is a 37 yo female who presented voluntarily and accomapnied by her mother, Lance Coon, and her daughter, Fredric Mare, seeking detox for drinking alcohol. Pt endorsed passive SI with no plan, denied HI, NSSH, AVH and paranoia. Pt reported drinking alcohol daily of 2 large bottles which her family says is equivilant to 3-4 bottles of wine. In addition, pt drinks and unknown amount of liquor daily. Pt does not have any currently OP psychiatric providers and has not been admitted to a psychiatric hospital in the past. Pt reported she also smokes cannabis about once a week and takes Xanax bars that she gets from "a nurse friend." Pt estimates she takes 1/4 bar of Xanax about 3 times per week.  How Long Has This Been Causing You Problems? > than 6 months  Have You Recently Had Any Thoughts About Hurting Yourself? Yes  How long ago did you have thoughts about hurting yourself? currently  Are You Planning to Commit Suicide/Harm Yourself At This time? No  Have you Recently Had Thoughts About Hurting Someone [name]? No  Are You Planning To Harm Someone At This Time? No  Are you currently experiencing any auditory, visual or other hallucinations? No (sometimes while drinking hears the voices of her family)  Have You Used Any Alcohol or Drugs in the Past 24 Hours? Yes  How long ago did you use Drugs or Alcohol? alcohol today  What Did You Use and How Much? unknown amount  Do you have any current medical co-morbidities that require immediate attention? No  Clinician description of patient physical appearance/behavior: tearful, slurring words, shuffling her feet, dressed casually and disheveled. judgment and insight are poor to impaired. mood is depressed with a flat affect  What Do You Feel Would Help You the Most Today? Alcohol or Drug Use Treatment  If access to  [facility] was not available, would you have sought care in the Emergency Department? Yes  Determination of Need Urgent (48 hours)  Options For Referral Facility-Based Crisis
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
## 2022-07-09 NOTE — ED Notes (Signed)
Pt is found vomiting in the bathroom. Writer  administered zofran 4mg  to pt.

## 2022-07-09 NOTE — ED Notes (Signed)
Pt is intoxicated with alcohol. Pt is oriented to the unit and provided with meal. Medication has been administered to him and pt is lying on his bed. Pt denies SI/HI/AVH. Will continue to monitor for safety.

## 2022-07-10 ENCOUNTER — Encounter (HOSPITAL_COMMUNITY): Payer: Self-pay | Admitting: Behavioral Health

## 2022-07-10 DIAGNOSIS — F419 Anxiety disorder, unspecified: Secondary | ICD-10-CM | POA: Diagnosis not present

## 2022-07-10 MED ORDER — NICOTINE 14 MG/24HR TD PT24
14.0000 mg | MEDICATED_PATCH | Freq: Once | TRANSDERMAL | Status: AC
  Administered 2022-07-10: 14 mg via TRANSDERMAL
  Filled 2022-07-10: qty 1

## 2022-07-10 MED ORDER — CALCIUM CARBONATE ANTACID 500 MG PO CHEW
1.0000 | CHEWABLE_TABLET | Freq: Once | ORAL | Status: AC
  Administered 2022-07-10: 200 mg via ORAL
  Filled 2022-07-10: qty 1

## 2022-07-10 MED ORDER — NICOTINE POLACRILEX 2 MG MT GUM
2.0000 mg | CHEWING_GUM | Freq: Once | OROMUCOSAL | Status: AC
  Administered 2022-07-10: 2 mg via ORAL
  Filled 2022-07-10: qty 1

## 2022-07-10 NOTE — ED Notes (Signed)
Pt is sleeping. No distress noted. Will continue to monitor for safety. 

## 2022-07-10 NOTE — ED Notes (Signed)
Pt is attending AA and group. 

## 2022-07-10 NOTE — ED Notes (Signed)
Patient was provided with breakfast 

## 2022-07-10 NOTE — Tx Team (Signed)
LCSW met with patient to assess current mood, affect, physical state, and inquire about needs/goals while here in [place] and after discharge. Patient reports she presented due to needing to detox from alcohol. Patient reports a recent increase in use over the last 3 months after a recent breakup from her partner of 6 years. Patient reports on average she would drink some wine 3-4 days out of the week. However, reports since the breakup about 3 months ago she has been drinking 2-3 bottles of wine a day. Patient reports she also fluctuates between wine, beer, and hard liquor and reports the amount varies. Patient reports she last has been using Xanax pills about twice a week, provided by a RN friend. Patient reports she uses this due to her increased anxiety and tremors. Patient reports she lives at home alone  in [place] and reports her 49 year old daughter comes by to visit occasionally. Mother reports the daughter stays up the street with the grandmother due to their being more space in her house for their pets. Mother reports having support from her mother as needed. 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 Patient reports she has realized that her drinking has become a problem due to her not feeling physically well while at work. Patient denies any outpatient or inpatient treatment for substance use in the past. Patient reports she attempt to seek therapy online about 2 years ago, however reports it was ineffective. Patient reports her goal at this time due to her work schedule is to be linked to outpatient resources for therapy and medication management. Patient reports she has access to her own transportation and would be able to maintain appts as needed. Patient reports intensive outpatient would not work with her work schedule. Patient denies having any legal charges or upcoming  court dates. Patient currently denies any SI/HI/AVH and reports mood as anxious. Patient aware that LCSW will send referral for review and will follow up to provide updates as received. Patient expressed understanding and appreciation of LCSW assistance. No other needs were reported at this time by patient.   Referral will likely be made for Outpatient services here at the [facility] for therapy and medication management.   Fernande Boyden, LCSW Clinical Social Worker [place] [facility] Ph: [phone]

## 2022-07-10 NOTE — ED Notes (Signed)
Doing rounds pt has been in her room pacing back and forth for the past 2 hours.

## 2022-07-10 NOTE — ED Notes (Signed)
Patient A&Ox4. Denies intent to harm self/others when asked. Denies A/VH. Patient denies any physical complaints when asked. Mild tremors observed during medication pass. Pt states, "I still feel better than I did yesterday". Support and encouragement provided. Routine safety checks conducted according to facility protocol. Encouraged patient to notify staff if thoughts of harm toward self or others arise. Patient verbalize understanding and agreement. Will continue to monitor for safety.

## 2022-07-10 NOTE — ED Notes (Signed)
Pt sitting in dayroom interacting with peers and watching tv. No acute distress noted. No concerns voiced. Informed pt to notify staff with any needs or assistance. Pt verbalized understanding or agreement. Will continue to monitor for safety. 

## 2022-07-10 NOTE — ED Notes (Signed)
Patient is sleeping. Respirations equal and unlabored, skin warm and dry. No change in assessment or acuity. Routine safety checks conducted according to facility protocol. Will continue to monitor for safety.   

## 2022-07-10 NOTE — ED Notes (Signed)
Pt sleeping in no acute distress. RR even and unlabored. Environment secured. Will continue to monitor for safety. 

## 2022-07-10 NOTE — ED Notes (Signed)
Patient was provided with dinner 

## 2022-07-10 NOTE — ED Notes (Signed)
Pt is in the dayroom watching TV with peers. Pt denies SI/HI/AVH. No acute distress noted. Will continue to monitor for safety. 

## 2022-07-10 NOTE — ED Notes (Signed)
Patient was provided with lunch 

## 2022-07-10 NOTE — ED Provider Notes (Signed)
Behavioral Health Progress Note  Date and Time: 07/10/2022 11:56 AM Name: Tanya Goodman MRN:  [id number]  Subjective:  Patient seen this morning. She reports having anxiety but otherwise, her symptoms of tremor and diarrhea have improved. She states her alcohol use increased in the last 3 months due to a recent break up around that time. She is interested in an intensive outpatient program for alcohol use. She currently denies SI, HI, and AVH.   Diagnosis:  Final diagnoses:  Alcohol use disorder    Total Time spent with patient: 45 minutes  Past Psychiatric History: Dx: Anxiety Current medications: Denies Past medications: Buspar - did not feel it was effect nor did she take consistently, was previously prescribed an SSRI (unsure of which one) but she did not start the medication due to having anxiety regarding the medication.  Past SI: Denies Past psychiatric hospitalizations: Denies Past Medical History: Spontaneous vaginal delivery Family Psychiatric History:  uncle has MDD, denies family hx of substance use or suicidal ideation Social History:  [place] Support: mother and 36 year old daughter       Tobacco Use   Smoking status: Every Day      Packs/day: 0.25      Years: 5.00      Additional pack years: 0.00      Total pack years: 1.25      Types: Cigarettes   Smokeless tobacco: Never  Substance Use Topics   Alcohol use: Yes   Drug use: Yes      Types: Marijuana    Additional Social History:    Pain Medications: See MAR Prescriptions: See MAR Over the Counter: See MAR History of alcohol / drug use?: Yes Longest period of sobriety (when/how long): 3 months. Negative Consequences of Use:  (Unsure.) Withdrawal Symptoms: Nausea / Vomiting, Other (Comment) (Pt reports, her body hurts, she's depressed, her brain is shaking, body shaking.) Name of Substance 1: Alcohol. 1 - Age of First Use: 16. 1 - Amount (size/oz): Pt reports, she drank a glass of wine  four hours ago. 1 - Frequency: Pt reports, she drinks everyday. 1 - Duration: Ongoing. 1 - Last Use / Amount: Per pt, four hours ago. 1 - Method of Aquiring: Purchase. 1- Route of Use: Oral.      Sleep: Poor  Appetite:  Fair  Current Medications:  Current Facility-Administered Medications  Medication Dose Route Frequency Provider Last Rate Last Admin   acetaminophen (TYLENOL) tablet 650 mg  650 mg Oral Q6H PRN Sunday CornSellers, Amanda E, NP       alum & mag hydroxide-simeth (MAALOX/MYLANTA) 200-200-20 MG/5ML suspension 30 mL  30 mL Oral Q4H PRN Sunday CornSellers, Amanda E, NP       hydrOXYzine (ATARAX) tablet 25 mg  25 mg Oral Q6H PRN Sunday CornSellers, Amanda E, NP       loperamide (IMODIUM) capsule 2-4 mg  2-4 mg Oral PRN Sunday CornSellers, Amanda E, NP       LORazepam (ATIVAN) tablet 1 mg  1 mg Oral Q6H PRN Sunday CornSellers, Amanda E, NP   1 mg at 07/09/22 2053   LORazepam (ATIVAN) tablet 1 mg  1 mg Oral QID Sunday CornSellers, Amanda E, NP   1 mg at 07/10/22 0920   Followed by   Melene Muller[START ON 07/11/2022] LORazepam (ATIVAN) tablet 1 mg  1 mg Oral TID Sunday CornSellers, Amanda E, NP       Followed by   Melene Muller[START ON 07/12/2022] LORazepam (ATIVAN) tablet 1 mg  1 mg Oral BID Sellers,  Verdon Cummins, NP       Followed by   Melene Muller ON 07/14/2022] LORazepam (ATIVAN) tablet 1 mg  1 mg Oral Daily Erskine Emery E, NP       magnesium hydroxide (MILK OF MAGNESIA) suspension 30 mL  30 mL Oral Daily PRN Sunday Corn, NP       multivitamin with minerals tablet 1 tablet  1 tablet Oral Daily Sunday Corn, NP   1 tablet at 07/10/22 0920   nicotine (NICODERM CQ - dosed in mg/24 hours) patch 14 mg  14 mg Transdermal Once Sindy Guadeloupe, NP   14 mg at 07/10/22 0129   ondansetron (ZOFRAN-ODT) disintegrating tablet 4 mg  4 mg Oral Q6H PRN Sunday Corn, NP   4 mg at 07/09/22 2323   thiamine (VITAMIN B1) tablet 100 mg  100 mg Oral Daily Sunday Corn, NP   100 mg at 07/10/22 0920   traZODone (DESYREL) tablet 50 mg  50 mg Oral QHS PRN Sunday Corn, NP   50 mg at  07/09/22 2127   No current outpatient medications on file.    Labs  Lab Results:  Admission on 07/09/2022  Component Date Value Ref Range Status   WBC 07/09/2022 4.5  4.0 - 10.5 K/uL Final   RBC 07/09/2022 4.03  3.87 - 5.11 MIL/uL Final   Hemoglobin 07/09/2022 14.1  12.0 - 15.0 g/dL Final   HCT 78/29/5621 41.0  36.0 - 46.0 % Final   MCV 07/09/2022 101.7 (H)  80.0 - 100.0 fL Final   MCH 07/09/2022 35.0 (H)  26.0 - 34.0 pg Final   MCHC 07/09/2022 34.4  30.0 - 36.0 g/dL Final   RDW 30/86/5784 12.2  11.5 - 15.5 % Final   Platelets 07/09/2022 213  150 - 400 K/uL Final   nRBC 07/09/2022 0.0  0.0 - 0.2 % Final   Neutrophils Relative % 07/09/2022 38  % Final   Neutro Abs 07/09/2022 1.7  1.7 - 7.7 K/uL Final   Lymphocytes Relative 07/09/2022 51  % Final   Lymphs Abs 07/09/2022 2.3  0.7 - 4.0 K/uL Final   Monocytes Relative 07/09/2022 9  % Final   Monocytes Absolute 07/09/2022 0.4  0.1 - 1.0 K/uL Final   Eosinophils Relative 07/09/2022 1  % Final   Eosinophils Absolute 07/09/2022 0.0  0.0 - 0.5 K/uL Final   Basophils Relative 07/09/2022 1  % Final   Basophils Absolute 07/09/2022 0.0  0.0 - 0.1 K/uL Final   Immature Granulocytes 07/09/2022 0  % Final   Abs Immature Granulocytes 07/09/2022 0.01  0.00 - 0.07 K/uL Final   Performed at [facility], [address], [place], [place] [address]   Sodium 07/09/2022 138  135 - 145 mmol/L Final   Potassium 07/09/2022 3.7  3.5 - 5.1 mmol/L Final   Chloride 07/09/2022 100  98 - 111 mmol/L Final   CO2 07/09/2022 18 (L)  22 - 32 mmol/L Final   Glucose, Bld 07/09/2022 199 (H)  70 - 99 mg/dL Final   Glucose reference range applies only to samples taken after fasting for at least 8 hours.   BUN 07/09/2022 12  6 - 20 mg/dL Final   Creatinine, Ser 07/09/2022 0.57  0.44 - 1.00 mg/dL Final   Calcium 52/84/1324 9.6  8.9 - 10.3 mg/dL Final   Total Protein 40/12/2723 6.9  6.5 - 8.1 g/dL Final   Albumin 36/64/4034 3.9  3.5 - 5.0 g/dL Final   AST  07/09/2022 342 (H)  15 - 41 U/L Final   ALT 07/09/2022 177 (H)  0 - 44 U/L Final   Alkaline Phosphatase 07/09/2022 92  38 - 126 U/L Final   Total Bilirubin 07/09/2022 0.7  0.3 - 1.2 mg/dL Final   GFR, Estimated 07/09/2022 >60  >60 mL/min Final   Comment: (NOTE) Calculated using the CKD-EPI Creatinine Equation (2021)    Anion gap 07/09/2022 20 (H)  5 - 15 Final   Comment: ELECTROLYTES REPEATED TO VERIFY Performed at [facility], [address], [place], [place] [address]    Hgb A1c MFr Bld 07/09/2022 5.2  4.8 - 5.6 % Final   Comment: (NOTE) Pre diabetes:          5.7%-6.4%  Diabetes:              >6.4%  Glycemic control for   <7.0% adults with diabetes    Mean Plasma Glucose 07/09/2022 102.54  mg/dL Final   Performed at [facility], [address] [place], [place] [address]   Magnesium 07/09/2022 1.9  1.7 - 2.4 mg/dL Final   Performed at [facility], [address], [place], [place] [address]   Alcohol, Ethyl (B) 07/09/2022 268 (H)  <10 mg/dL Final   Comment: (NOTE) Lowest detectable limit for serum alcohol is 10 mg/dL.  For medical purposes only. Performed at [facility], [address], [place], [place] [address]    Cholesterol 07/09/2022 328 (H)  0 - 200 mg/dL Final   Triglycerides 56/38/7564 191 (H)  <150 mg/dL Final   HDL 33/29/5188 >135  >40 mg/dL Final   Total CHOL/HDL Ratio 07/09/2022 NOT CALCULATED  RATIO Final   VLDL 07/09/2022 38  0 - 40 mg/dL Final   LDL Cholesterol 07/09/2022 NOT CALCULATED  0 - 99 mg/dL Final   Performed at [facility], [address], [place], [place] [address]   TSH 07/09/2022 1.581  0.350 - 4.500 uIU/mL Final   Comment: Performed by a 3rd Generation assay with a functional sensitivity of <=0.01 uIU/mL. Performed at [facility], [address] [place], [place] [address]    Preg Test, Ur 07/09/2022 Negative  Negative Final   POC Amphetamine UR 07/09/2022 None Detected  NONE DETECTED (Cut Off Level  1000 ng/mL) Final   POC Secobarbital (BAR) 07/09/2022 None Detected  NONE DETECTED (Cut Off Level 300 ng/mL) Final   POC Buprenorphine (BUP) 07/09/2022 None Detected  NONE DETECTED (Cut Off Level 10 ng/mL) Final   POC Oxazepam (BZO) 07/09/2022 Positive (A)  NONE DETECTED (Cut Off Level 300 ng/mL) Final   POC Cocaine UR 07/09/2022 None Detected  NONE DETECTED (Cut Off Level 300 ng/mL) Final   POC Methamphetamine UR 07/09/2022 None Detected  NONE DETECTED (Cut Off Level 1000 ng/mL) Final   POC Morphine 07/09/2022 None Detected  NONE DETECTED (Cut Off Level 300 ng/mL) Final   POC Methadone UR 07/09/2022 None Detected  NONE DETECTED (Cut Off Level 300 ng/mL) Final   POC Oxycodone UR 07/09/2022 None Detected  NONE DETECTED (Cut Off Level 100 ng/mL) Final   POC Marijuana UR 07/09/2022 Positive (A)  NONE DETECTED (Cut Off Level 50 ng/mL) Final   SARSCOV2ONAVIRUS 2 AG 07/09/2022 NEGATIVE  NEGATIVE Final   Comment: (NOTE) SARS-CoV-2 antigen NOT DETECTED.   Negative results are presumptive.  Negative results do not preclude SARS-CoV-2 infection and should not be used as the sole basis for treatment or other patient  management decisions, including infection  control decisions, particularly in the presence of clinical signs and  symptoms consistent with COVID-19, or in those who have been in contact with the virus.  Negative results must be combined with clinical observations, patient history, and epidemiological information. The expected result is Negative.  Fact Sheet for Patients: https://www.jennings-kim.com/  Fact Sheet for Healthcare Providers: https://alexander-rogers.biz/  This test is not yet approved or cleared by the [place] FDA and  has been authorized for detection and/or diagnosis of SARS-CoV-2 by FDA under an Emergency Use Authorization (EUA).  This EUA will remain in effect (meaning this test can be used) for the duration of  the COV                           ID-19 declaration under Section 564(b)(1) of the Act, 21 U.S.C. section 360bbb-3(b)(1), unless the authorization is terminated or revoked sooner.     Preg Test, Ur 07/09/2022 NEGATIVE  NEGATIVE Final   Comment:        THE SENSITIVITY OF THIS METHODOLOGY IS >24 mIU/mL    SARS Coronavirus 2 by RT PCR 07/09/2022 NEGATIVE  NEGATIVE Final   Performed at [facility], [address], [place], [place] [address]    Blood Alcohol level:  Lab Results  Component Value Date   ETH 268 (H) 07/09/2022    Metabolic Disorder Labs: Lab Results  Component Value Date   HGBA1C 5.2 07/09/2022   MPG 102.54 07/09/2022   No results found for: "PROLACTIN" Lab Results  Component Value Date   CHOL 328 (H) 07/09/2022   TRIG 191 (H) 07/09/2022   HDL >135 07/09/2022   CHOLHDL NOT CALCULATED 07/09/2022   VLDL 38 07/09/2022   LDLCALC NOT CALCULATED 07/09/2022    Therapeutic Lab Levels: No results found for: "LITHIUM" No results found for: "VALPROATE" No results found for: "CBMZ"  Physical Findings   PHQ2-9    Flowsheet Row ED from 07/09/2022 in [facility]  PHQ-2 Total Score 5  PHQ-9 Total Score 20      Flowsheet Row ED from 07/09/2022 in [facility]  C-SSRS RISK CATEGORY No Risk        Musculoskeletal  Strength & Muscle Tone: within normal limits Gait & Station: normal Patient leans: N/A  Psychiatric Specialty Exam  Presentation  General Appearance:  Disheveled  Eye Contact: Fair  Speech: Normal Rate  Speech Volume: Normal  Handedness: Right   Mood and Affect  Mood: Anxious  Affect: Congruent   Thought Process  Thought Processes: Coherent  Descriptions of Associations:Intact  Orientation:Full (Time, Place and Person)  Thought Content:Logical  Diagnosis of Schizophrenia or Schizoaffective disorder in past: No    Hallucinations:Hallucinations: None  Ideas of  Reference:None  Suicidal Thoughts:Suicidal Thoughts: No  Homicidal Thoughts:Homicidal Thoughts: No   Sensorium  Memory: Immediate Good; Recent Good; Remote Good  Judgment: Fair  Insight: Fair   Chartered certified accountant: Fair  Attention Span: Fair  Recall: Fair  Fund of Knowledge: Fair  Language: Fair   Psychomotor Activity  Psychomotor Activity: Psychomotor Activity: Tremor   Assets  Assets: Desire for Improvement; Communication Skills   Sleep  Sleep: Sleep: Poor Number of Hours of Sleep: 10   Nutritional Assessment (For OBS and FBC admissions only) Has the patient had a weight loss or gain of 10 pounds or more in the last 3 months?: No Has the patient had a  decrease in food intake/or appetite?: No Does the patient have dental problems?: No Does the patient have eating habits or behaviors that may be indicators of an eating disorder including binging or inducing vomiting?: No Has the patient recently lost weight without trying?: 0 Has the patient been eating poorly because of a decreased appetite?: 1 Malnutrition Screening Tool Score: 1    Physical Exam  Physical Exam  General: Pleasant, well-appearing female in bed. No acute distress. CV: RRR. No murmurs, rubs, or gallops. No LE edema Pulmonary: Lungs CTAB. Normal effort. No wheezing or rales. Abdominal: Soft, nontender, nondistended. Normal bowel sounds. Extremities: Radial and DP pulses 2+ and symmetric. Normal ROM. Skin: Warm and dry. No obvious rash or lesions. Neuro: A&Ox3. Moves all extremities. Normal sensation. No focal deficit. Psych: Normal mood and affect   Review of Systems  Constitutional: Negative.  Negative for chills, fever and weight loss.   Respiratory: Negative.    Cardiovascular: Negative.   Gastrointestinal:  Negative for constipation, diarrhea, nausea and vomiting.  Genitourinary: Negative.   Musculoskeletal: Negative.   Skin: Negative.   Neurological:  Negative.  Negative for tingling.     Blood pressure 105/86, pulse (!) 126, temperature 98.3 F (36.8 C), temperature source Tympanic, resp. rate 18, SpO2 98 %. There is no height or weight on file to calculate BMI.  Treatment Plan Summary: Daily contact with patient to assess and evaluate symptoms and progress in treatment, Medication management, and Plan Librium taper, CIWA, possible IOP, interest in applying for insurance  Lance Muss, MD 07/10/2022 11:56 AM

## 2022-07-10 NOTE — Group Note (Signed)
Group Topic: Social Support  Group Date: 07/10/2022 Start Time: 1000 End Time: 1100 Facilitators: Maeola Sarah  Department: [facility]  Number of Participants: 5  Group Focus: social skills Treatment Modality:  Psychoeducation Interventions utilized were group exercise and support Purpose: enhance coping skills and improve communication skills  Name: Tanya Goodman Date of Birth: 1985-07-25  MR: [id number]    Level of Participation: minimal Quality of Participation: attentive and cooperative Interactions with others: Listened attentively to others sharing Mood/Affect: appropriate Triggers (if applicable): N/A Cognition: coherent/clear Progress: Minimal Response: Patient listened as her peers shared during group, but patient was observed nodding her head when things were said that she agreed with Plan: patient will be encouraged to continue attending groups and slowly work on sharing  Patients Problems:  Patient Active Problem List   Diagnosis Date Noted   Alcohol use disorder 07/09/2022

## 2022-07-11 DIAGNOSIS — F419 Anxiety disorder, unspecified: Secondary | ICD-10-CM | POA: Diagnosis not present

## 2022-07-11 LAB — PROLACTIN: Prolactin: 11 ng/mL (ref 4.8–33.4)

## 2022-07-11 MED ORDER — NICOTINE POLACRILEX 2 MG MT GUM
2.0000 mg | CHEWING_GUM | OROMUCOSAL | Status: DC | PRN
  Administered 2022-07-11: 4 mg via ORAL
  Administered 2022-07-12: 2 mg via ORAL
  Filled 2022-07-11: qty 2
  Filled 2022-07-11: qty 1

## 2022-07-11 MED ORDER — MELATONIN 5 MG PO TABS
5.0000 mg | ORAL_TABLET | Freq: Every day | ORAL | Status: DC
  Administered 2022-07-11 – 2022-07-12 (×2): 5 mg via ORAL
  Filled 2022-07-11 (×2): qty 1
  Filled 2022-07-11: qty 7

## 2022-07-11 MED ORDER — NICOTINE 14 MG/24HR TD PT24
14.0000 mg | MEDICATED_PATCH | Freq: Every day | TRANSDERMAL | Status: DC | PRN
  Administered 2022-07-11: 14 mg via TRANSDERMAL
  Filled 2022-07-11 (×2): qty 1

## 2022-07-11 MED ORDER — TRAZODONE HCL 100 MG PO TABS
100.0000 mg | ORAL_TABLET | Freq: Every evening | ORAL | Status: DC | PRN
  Filled 2022-07-11: qty 7

## 2022-07-11 NOTE — ED Notes (Signed)
Patient request patch and medication for aniexty

## 2022-07-11 NOTE — ED Notes (Signed)
Patient is watching tv in the dayroom. Environment secured. Alert and oriented. Will continue to monitor for safety. 

## 2022-07-11 NOTE — ED Notes (Signed)
Pt is in the dayroom watching TV with peers. Pt denies SI/HI/AVH. No acute distress noted. Will continue to monitor for safety. 

## 2022-07-11 NOTE — ED Notes (Signed)
Patient  sleeping in no acute stress. RR even and unlabored .Environment secured .Will continue to monitor for safely. 

## 2022-07-11 NOTE — ED Notes (Signed)
Patient is sleeping. Respirations equal and unlabored, skin warm and dry. No change in assessment or acuity. Routine safety checks conducted according to facility protocol. Will continue to monitor for safety.   

## 2022-07-11 NOTE — ED Notes (Addendum)
Patient is requesting a nicotine patch. Nicotine patch was removed.Rn notified provider.

## 2022-07-11 NOTE — Group Note (Signed)
Group Topic: Understanding Self  Group Date: 07/11/2022 Start Time: 1000 End Time: 1100 Facilitators: Londell Moh, NT  Department: [facility]  Number of Participants: 5  Group Focus: acceptance, clarity of thought, communication, coping skills, healthy friendships, personal responsibility, self-awareness, and self-esteem Treatment Modality:  Patient-Centered Therapy Interventions utilized were clarification, patient education, and support Purpose: enhance coping skills, express feelings, improve communication skills, increase insight, regain self-worth, and reinforce self-care  Name: Tanya Goodman Date of Birth: 1985/11/08  MR: [id number]    Level of Participation: moderate Quality of Participation: attentive, cooperative, engaged, and quiet Interactions with others: gave feedback Mood/Affect: brightens with interaction and positive Triggers (if applicable): n/a Cognition: coherent/clear and insightful Progress: Moderate Response: Gained knowledge from group Plan: patient will be encouraged to continue to attend groups  Patients Problems:  Patient Active Problem List   Diagnosis Date Noted   Alcohol use disorder 07/09/2022

## 2022-07-11 NOTE — ED Notes (Signed)
Patient was provided with lunch 

## 2022-07-11 NOTE — ED Notes (Signed)
Patients blood pressure was elevated with the blood pressure machine rechecked manually it was 132/90 heart rate 112 denies any s/s of hypertension.

## 2022-07-11 NOTE — ED Notes (Signed)
Patient was provided with breakfast 

## 2022-07-11 NOTE — [facility] Group Notes (Signed)
[facility] LCSW Group Therapy   Date/ Time: 07/11/2022 at 1:35PM  Type of Therapy:  Group Therapy  Participation Level:  Active  Participation Quality:  Appropriate  Affect:  Appropriate  Cognitive:  Appropriate  Insight:  Developing/Improving  Engagement in Therapy:  Developing/Improving  Modes of Intervention:  Activity, Discussion, Rapport Building, Socialization and Support  Summary of Progress/Problems: Patient actively participated in group on today. Group started off with introductions and group rules. Group members participated in a therapeutic activity that required active listening and communication skills. Group members were able to identify similarities and differences within the group. Patient interacted positively with staff and peers. No issues to report.    , LCSW Clinical Social Worker [facility] Ph: [phone] 

## 2022-07-11 NOTE — ED Notes (Signed)
Patient was provided with dinner 

## 2022-07-11 NOTE — ED Provider Notes (Signed)
Behavioral Health Progress Note  Date and Time: 07/11/2022 8:32 AM Name: Tanya Goodman MRN:  [id number]  Subjective:  Tanya Goodman is a 37 y/o female with a psychiatric history of GAD and alcohol use disorder who presented voluntarily for help with alcohol detox, for which she was admitted to [facility].  On assessment today, patient reports feeling better overall. She reports having anxiety but less than previous day. She denies somatic concerns today and does not appear as tremulous as she was yesterday. Of her next steps, she is primarily interested in outpatient services but is open to IOP if virtual due to lack of transportation. She currently denies SI, HI, and AVH. She reports that she feels close to ready to getting back to her life but she wants to ensure her sleep has improved in the setting of withdrawals.   Diagnosis:  Final diagnoses:  Alcohol use disorder    Total Time spent with patient: 45 minutes  Past Psychiatric History: Dx: Anxiety Current medications: Denies Past medications: Buspar - did not feel it was effect nor did she take consistently, was previously prescribed an SSRI (unsure of which one) but she did not start the medication due to having anxiety regarding the medication.  Past SI: Denies Past psychiatric hospitalizations: Denies Past Medical History: Spontaneous vaginal delivery Family Psychiatric History:  uncle has MDD, denies family hx of substance use or suicidal ideation Social History:  Living: [employer] Support: mother and 34 year old daughter       Tobacco Use   Smoking status: Every Day      Packs/day: 0.25      Years: 5.00      Additional pack years: 0.00      Total pack years: 1.25      Types: Cigarettes   Smokeless tobacco: Never  Substance Use Topics   Alcohol use: Yes   Drug use: Yes      Types: Marijuana    Additional Social History:    Pain Medications: See MAR Prescriptions: See MAR Over the Counter: See MAR History of  alcohol / drug use?: Yes Longest period of sobriety (when/how long): 3 months. Negative Consequences of Use:  (Unsure.) Withdrawal Symptoms: Nausea / Vomiting, Other (Comment) (Pt reports, her body hurts, she's depressed, her brain is shaking, body shaking.) Name of Substance 1: Alcohol. 1 - Age of First Use: 16. 1 - Amount (size/oz): Pt reports, she drank a glass of wine four hours ago. 1 - Frequency: Pt reports, she drinks everyday. 1 - Duration: Ongoing. 1 - Last Use / Amount: Per pt, four hours ago. 1 - Method of Aquiring: Purchase. 1- Route of Use: Oral.      Sleep: Fair  Appetite:  Fair  Current Medications:  Current Facility-Administered Medications  Medication Dose Route Frequency Provider Last Rate Last Admin   acetaminophen (TYLENOL) tablet 650 mg  650 mg Oral Q6H PRN Sunday Corn, NP       alum & mag hydroxide-simeth (MAALOX/MYLANTA) 200-200-20 MG/5ML suspension 30 mL  30 mL Oral Q4H PRN Sunday Corn, NP       hydrOXYzine (ATARAX) tablet 25 mg  25 mg Oral Q6H PRN Sunday Corn, NP       loperamide (IMODIUM) capsule 2-4 mg  2-4 mg Oral PRN Sunday Corn, NP       LORazepam (ATIVAN) tablet 1 mg  1 mg Oral Q6H PRN Sunday Corn, NP   1 mg at 07/09/22 2053   LORazepam (  ATIVAN) tablet 1 mg  1 mg Oral QID Sunday CornSellers, Amanda E, NP   1 mg at 07/10/22 2114   Followed by   LORazepam (ATIVAN) tablet 1 mg  1 mg Oral TID Sunday CornSellers, Amanda E, NP       Followed by   Melene Muller[START ON 07/12/2022] LORazepam (ATIVAN) tablet 1 mg  1 mg Oral BID Sunday CornSellers, Amanda E, NP       Followed by   Melene Muller[START ON 07/14/2022] LORazepam (ATIVAN) tablet 1 mg  1 mg Oral Daily Sunday CornSellers, Amanda E, NP       magnesium hydroxide (MILK OF MAGNESIA) suspension 30 mL  30 mL Oral Daily PRN Sunday CornSellers, Amanda E, NP       multivitamin with minerals tablet 1 tablet  1 tablet Oral Daily Sunday CornSellers, Amanda E, NP   1 tablet at 07/10/22 0920   ondansetron (ZOFRAN-ODT) disintegrating tablet 4 mg  4 mg Oral Q6H PRN Sunday CornSellers,  Amanda E, NP   4 mg at 07/09/22 2323   thiamine (VITAMIN B1) tablet 100 mg  100 mg Oral Daily Erskine EmerySellers, Amanda E, NP   100 mg at 07/10/22 0920   traZODone (DESYREL) tablet 50 mg  50 mg Oral QHS PRN Sunday CornSellers, Amanda E, NP   50 mg at 07/10/22 2113   No current outpatient medications on file.    Labs  Lab Results:  Admission on 07/09/2022  Component Date Value Ref Range Status   WBC 07/09/2022 4.5  4.0 - 10.5 K/uL Final   RBC 07/09/2022 4.03  3.87 - 5.11 MIL/uL Final   Hemoglobin 07/09/2022 14.1  12.0 - 15.0 g/dL Final   HCT 16/10/960404/12/2022 41.0  36.0 - 46.0 % Final   MCV 07/09/2022 101.7 (H)  80.0 - 100.0 fL Final   MCH 07/09/2022 35.0 (H)  26.0 - 34.0 pg Final   MCHC 07/09/2022 34.4  30.0 - 36.0 g/dL Final   RDW 54/09/811904/12/2022 12.2  11.5 - 15.5 % Final   Platelets 07/09/2022 213  150 - 400 K/uL Final   nRBC 07/09/2022 0.0  0.0 - 0.2 % Final   Neutrophils Relative % 07/09/2022 38  % Final   Neutro Abs 07/09/2022 1.7  1.7 - 7.7 K/uL Final   Lymphocytes Relative 07/09/2022 51  % Final   Lymphs Abs 07/09/2022 2.3  0.7 - 4.0 K/uL Final   Monocytes Relative 07/09/2022 9  % Final   Monocytes Absolute 07/09/2022 0.4  0.1 - 1.0 K/uL Final   Eosinophils Relative 07/09/2022 1  % Final   Eosinophils Absolute 07/09/2022 0.0  0.0 - 0.5 K/uL Final   Basophils Relative 07/09/2022 1  % Final   Basophils Absolute 07/09/2022 0.0  0.0 - 0.1 K/uL Final   Immature Granulocytes 07/09/2022 0  % Final   Abs Immature Granulocytes 07/09/2022 0.01  0.00 - 0.07 K/uL Final   Performed at [facility], [address] [place], [place] [address]   Sodium 07/09/2022 138  135 - 145 mmol/L Final   Potassium 07/09/2022 3.7  3.5 - 5.1 mmol/L Final   Chloride 07/09/2022 100  98 - 111 mmol/L Final   CO2 07/09/2022 18 (L)  22 - 32 mmol/L Final   Glucose, Bld 07/09/2022 199 (H)  70 - 99 mg/dL Final   Glucose reference range applies only to samples taken after fasting for at least 8 hours.   BUN 07/09/2022 12  6 - 20 mg/dL  Final   Creatinine, Ser 07/09/2022 0.57  0.44 - 1.00 mg/dL Final  Calcium 07/09/2022 9.6  8.9 - 10.3 mg/dL Final   Total Protein 83/38/2505 6.9  6.5 - 8.1 g/dL Final   Albumin 39/76/7341 3.9  3.5 - 5.0 g/dL Final   AST 93/79/0240 342 (H)  15 - 41 U/L Final   ALT 07/09/2022 177 (H)  0 - 44 U/L Final   Alkaline Phosphatase 07/09/2022 92  38 - 126 U/L Final   Total Bilirubin 07/09/2022 0.7  0.3 - 1.2 mg/dL Final   GFR, Estimated 07/09/2022 >60  >60 mL/min Final   Comment: (NOTE) Calculated using the CKD-EPI Creatinine Equation (2021)    Anion gap 07/09/2022 20 (H)  5 - 15 Final   Comment: ELECTROLYTES REPEATED TO VERIFY Performed at [facility], [address] [place], [place] [address]    Hgb A1c MFr Bld 07/09/2022 5.2  4.8 - 5.6 % Final   Comment: (NOTE) Pre diabetes:          5.7%-6.4%  Diabetes:              >6.4%  Glycemic control for   <7.0% adults with diabetes    Mean Plasma Glucose 07/09/2022 102.54  mg/dL Final   Performed at [facility], [address] [place], [place] [address]   Magnesium 07/09/2022 1.9  1.7 - 2.4 mg/dL Final   Performed at [facility], [address], [place], [place] [address]   Alcohol, Ethyl (B) 07/09/2022 268 (H)  <10 mg/dL Final   Comment: (NOTE) Lowest detectable limit for serum alcohol is 10 mg/dL.  For medical purposes only. Performed at [facility], [address], [place], [place] [address]    Cholesterol 07/09/2022 328 (H)  0 - 200 mg/dL Final   Triglycerides 29/79/8921 191 (H)  <150 mg/dL Final   HDL 19/41/7408 >135  >40 mg/dL Final   Total CHOL/HDL Ratio 07/09/2022 NOT CALCULATED  RATIO Final   VLDL 07/09/2022 38  0 - 40 mg/dL Final   LDL Cholesterol 07/09/2022 NOT CALCULATED  0 - 99 mg/dL Final   Performed at [facility], [address] [place], [place] [address]   TSH 07/09/2022 1.581  0.350 - 4.500 uIU/mL Final   Comment: Performed by a 3rd Generation assay with a functional sensitivity of  <=0.01 uIU/mL. Performed at [facility], [address], [place], [place] [address]    Prolactin 07/09/2022 11.0  4.8 - 33.4 ng/mL Final   Comment: (NOTE) Performed At: [facility] [address] [place], [place] [id number] Jolene Schimke MD HY:[id number]    Preg Test, Ur 07/09/2022 Negative  Negative Final   POC Amphetamine UR 07/09/2022 None Detected  NONE DETECTED (Cut Off Level 1000 ng/mL) Final   POC Secobarbital (BAR) 07/09/2022 None Detected  NONE DETECTED (Cut Off Level 300 ng/mL) Final   POC Buprenorphine (BUP) 07/09/2022 None Detected  NONE DETECTED (Cut Off Level 10 ng/mL) Final   POC Oxazepam (BZO) 07/09/2022 Positive (A)  NONE DETECTED (Cut Off Level 300 ng/mL) Final   POC Cocaine UR 07/09/2022 None Detected  NONE DETECTED (Cut Off Level 300 ng/mL) Final   POC Methamphetamine UR 07/09/2022 None Detected  NONE DETECTED (Cut Off Level 1000 ng/mL) Final   POC Morphine 07/09/2022 None Detected  NONE DETECTED (Cut Off Level 300 ng/mL) Final   POC Methadone UR 07/09/2022 None Detected  NONE DETECTED (Cut Off Level 300 ng/mL) Final   POC Oxycodone UR 07/09/2022 None Detected  NONE DETECTED (Cut Off Level  100 ng/mL) Final   POC Marijuana UR 07/09/2022 Positive (A)  NONE DETECTED (Cut Off Level 50 ng/mL) Final   SARSCOV2ONAVIRUS 2 AG 07/09/2022 NEGATIVE  NEGATIVE Final   Comment: (NOTE) SARS-CoV-2 antigen NOT DETECTED.   Negative results are presumptive.  Negative results do not preclude SARS-CoV-2 infection and should not be used as the sole basis for treatment or other patient management decisions, including infection  control decisions, particularly in the presence of clinical signs and  symptoms consistent with COVID-19, or in those who have been in contact with the virus.  Negative results must be combined with clinical observations, patient history, and epidemiological information. The expected result is Negative.  Fact Sheet for Patients:  https://www.jennings-kim.com/  Fact Sheet for Healthcare Providers: https://alexander-rogers.biz/  This test is not yet approved or cleared by the [place] FDA and  has been authorized for detection and/or diagnosis of SARS-CoV-2 by FDA under an Emergency Use Authorization (EUA).  This EUA will remain in effect (meaning this test can be used) for the duration of  the COV                          ID-19 declaration under Section 564(b)(1) of the Act, 21 U.S.C. section 360bbb-3(b)(1), unless the authorization is terminated or revoked sooner.     Preg Test, Ur 07/09/2022 NEGATIVE  NEGATIVE Final   Comment:        THE SENSITIVITY OF THIS METHODOLOGY IS >24 mIU/mL    SARS Coronavirus 2 by RT PCR 07/09/2022 NEGATIVE  NEGATIVE Final   Performed at [facility], [address], [place], [place] [address]    Blood Alcohol level:  Lab Results  Component Value Date   ETH 268 (H) 07/09/2022    Metabolic Disorder Labs: Lab Results  Component Value Date   HGBA1C 5.2 07/09/2022   MPG 102.54 07/09/2022   Lab Results  Component Value Date   PROLACTIN 11.0 07/09/2022   Lab Results  Component Value Date   CHOL 328 (H) 07/09/2022   TRIG 191 (H) 07/09/2022   HDL >135 07/09/2022   CHOLHDL NOT CALCULATED 07/09/2022   VLDL 38 07/09/2022   LDLCALC NOT CALCULATED 07/09/2022    Therapeutic Lab Levels: No results found for: "LITHIUM" No results found for: "VALPROATE" No results found for: "CBMZ"  Physical Findings   PHQ2-9    Flowsheet Row ED from 07/09/2022 in [facility]  PHQ-2 Total Score 5  PHQ-9 Total Score 20      Flowsheet Row ED from 07/09/2022 in [facility]  C-SSRS RISK CATEGORY No Risk        Musculoskeletal  Strength & Muscle Tone: within normal limits Gait & Station: normal Patient leans: N/A  Psychiatric Specialty Exam  Presentation  General Appearance:   Disheveled  Eye Contact: Fair  Speech: Normal Rate  Speech Volume: Normal  Handedness: Right   Mood and Affect  Mood: Anxious  Affect: Congruent   Thought Process  Thought Processes: Coherent  Descriptions of Associations:Intact  Orientation:Full (Time, Place and Person)  Thought Content:Logical  Diagnosis of Schizophrenia or Schizoaffective disorder in past: No    Hallucinations:Hallucinations: None  Ideas of Reference:None  Suicidal Thoughts:Suicidal Thoughts: No  Homicidal Thoughts:Homicidal Thoughts: No   Sensorium  Memory: Immediate Good; Recent Good; Remote Good  Judgment: Fair  Insight: Fair   Chartered certified accountant: Fair  Attention Span: Fair  Recall: [employer]  of Knowledge: Fair  Language: Fair   Lexicographer Activity: Psychomotor Activity: Tremor   Assets  Assets: Desire for Improvement; Communication Skills   Sleep  Sleep: Sleep: Poor   Nutritional Assessment (For OBS and FBC admissions only) Has the patient had a weight loss or gain of 10 pounds or more in the last 3 months?: No Has the patient had a decrease in food intake/or appetite?: No Does the patient have dental problems?: No Does the patient have eating habits or behaviors that may be indicators of an eating disorder including binging or inducing vomiting?: No Has the patient recently lost weight without trying?: 0 Has the patient been eating poorly because of a decreased appetite?: 1 Malnutrition Screening Tool Score: 1    Physical Exam  Physical Exam  General: Pleasant, well-appearing female in bed. No acute distress. CV: RRR. No murmurs, rubs, or gallops. No LE edema Pulmonary: Lungs CTAB. Normal effort. No wheezing or rales. Abdominal: Soft, nontender, nondistended. Normal bowel sounds. Extremities: Radial and DP pulses 2+ and symmetric. Normal ROM. Skin: Warm and dry. No obvious rash or lesions. Neuro:  A&Ox3. Moves all extremities. Normal sensation. No focal deficit. Psych: Normal mood and affect   Review of Systems  Constitutional: Negative.  Negative for chills, fever and weight loss.   Respiratory: Negative.    Cardiovascular: Negative.   Gastrointestinal:  Negative for constipation, diarrhea, nausea and vomiting.  Genitourinary: Negative.   Musculoskeletal: Negative.   Skin: Negative.   Neurological: Negative.  Negative for tingling.     Blood pressure (!) 140/89, pulse 86, temperature 98.7 F (37.1 C), temperature source Tympanic, resp. rate 16, SpO2 100 %. There is no height or weight on file to calculate BMI.  Treatment Plan Summary: Daily contact with patient to assess and evaluate symptoms and progress in treatment, Medication management, and Plan     AUD - Continue CIWA protocol for monitoring of withdrawal with po thiamine and MVI replacement, Ativan taper, and Ativan 1mg  for scores >10  GAD - Hydroxyzine PRN available. Patient declined all other standing medications   Dispo: Home with outpatient follow-up on 4/12 or 4/13.   Lamar Sprinkles, MD 07/11/2022 8:32 AM

## 2022-07-11 NOTE — ED Notes (Signed)
Patient alert and oriented x 3. Denies SI/HI/AVH. Denies intent or plan to harm self or others. Routine conducted according to faculty protocol. Encourage patient to notify staff with any needs or concerns. Patient verbalized agreement and understanding. Will continue to monitor for safety. 

## 2022-07-11 NOTE — Group Note (Signed)
Group Topic: Communication  Group Date: 07/11/2022 Start Time: 1115 End Time: 1200 Facilitators: Merrie Roof, RN  Department: [facility]  Number of Participants: 6  Group Focus: safety plan Treatment Modality:  Patient-Centered Therapy Interventions utilized were assignment Purpose: enhance coping skills  Name: Tanya Goodman Date of Birth: [birth year]  MR: [id number]    Level of Participation: active Quality of Participation: cooperative Interactions with others: gave feedback Mood/Affect: appropriate Triggers (if applicable):  Cognition: coherent/clear Progress: Significant Response:  Plan: patient will be encouraged to continue therpay.  Patients Problems:  Patient Active Problem List   Diagnosis Date Noted   Alcohol use disorder 07/09/2022

## 2022-07-11 NOTE — Discharge Instructions (Addendum)
You have an appt with: Appointment with [facility] on 4/16 at 8 AM with Les to be evaluated for therapy and medication management  [facility] [address] [place] [address], [place], [address] [phone] phone  -- For your nicotine patch please REMOVE before going to bed each night.  [facility] Address: [address] [place], [place] [address] Phone: [phone] Walk in Hours: Monday- Friday 9:00am- 1:00pm  [facility] [address], [place], [address] [phone] phone  New Patient Assessment/Therapy Walk-Ins:  Monday and Wednesday: 8 am until slots are full. Every 1st and 2nd Fridays of the month: 1 pm - 5 pm.  NO ASSESSMENT/THERAPY WALK-INS ON TUESDAYS OR THURSDAYS  New Patient Assessment/Medication Management Walk-Ins:  Monday - Friday:  8 am - 11 am.  For all walk-ins, we ask that you arrive by 7:30 am because patients will be seen in the order of arrival.  Availability is limited; therefore, you may not be seen on the same day that you walk-in.  Our goal is to serve and meet the needs of our community to the best of our ability.  SUBSTANCE USE TREATMENT for Medicaid and State Funded/IPRS  [facility] ([facility]) [address], [place], [address] [phone] phone NOTE: [facility] is no longer offering IOP services.  Serves those who are low-income or have no insurance.  [facility] [address], [place], [place], [address] [phone] phone [phone] fax NOTE: Does have Substance Abuse-Intensive Outpatient Program [facility]) as well as transitional housing if eligible.  [facility] [address] [place], [place], [address] [phone] phone [phone] fax  [facility] [phone] [place], [place], [address] [phone] phone [phone] fax  HALFWAY HOUSES:  [facility] [phone]  [employer].oxfordvacancies.com  12 STEP  PROGRAMS:  Alcoholics Anonymous of [place] Software[employer]alet.be  Narcotics Anonymous of [place] [employer]ct.dk  Al-Anon of [place], [place] www.[place]alanon.org/find-meetings.html  Nar-Anon https://nar-anon.org/find-a-meetin  List of Residential placements:   [facility] in [place]: [phone]  [facility]: [phone]  [place], [place] [phone]: Female and female facility; 30-day program: (uninsured and Medicaid such as Laurena Bering, [place], [place], partners)  [facility]: [phone]; men and women's facility; 28 days; Can have Medicaid tailored plan Tour manager or Partners)  [facility]: [phone] Karoline Caldwell or Larita Fife; 28 day program; must be fully detox; tailored Medicaid or no insurance  [address] in [place], [place]; [phone]; 28 day all males program; no insurance accepted  [facility] Referral in [place]: Gabriel Rung [phone] (no insurance or Medicaid only); 90 days; outpatient services but provide housing in apartments downtown [place]  [facility] Admission: [phone]: Patient must complete phone screening for placement: [place], [place]; 6 month program; uninsured, Medicaid, and [employer].   [facility]: no insurance required; [phone]  [facility]: [phone]; Intake: Molly Maduro; Must fill out application online; Alecia Lemming Delay [phone] x [address] Mission in [place], [place]: [phone]; Admissions Coordinators Mr. Maurine Minister or Barron Alvine; 90 day program.  [facility]: [place], [place] [phone]; Co-Ed 9 month to a year program; Online application; Men entry fee is $500 (6-2months);  [employer]: [address] [place], [place] [address]; no fee or insurance required; minimum of 2 years; Highly structured; work based; Intake Coordinator is Thayer Ohm [phone]  [facility] in  [place], [place]: [phone]; Fax number is [phone]; website: www.[facility].org; Requires 3-6 page autobiography; 2 year program (18  months and then 83month transitional housing); Admission fee is $300; no insurance needed; work Automotive engineer in [place], [place]: [employer] Desk Staff: Danise Edge [phone]: They have a Men's Regenerations Program 6-45months. Free program; There is an initial $300 fee however, they are willing to work with patients regarding that. Application is online.  [facility]: Admissions [phone] Doran Heater ext [phone]; Any 7-90 day program is out of pocket; 12 month program is free of charge; there is a $275 entry fee; Patient is responsible for own transportation

## 2022-07-11 NOTE — Discharge Planning (Signed)
LCSW received update that patient is interested in intensive outpatient services upon discharge. Referral was sent to Alene Mires for review. Once update has been provided, LCSW will follow up to inform. Additional resources have also been updated in the patient's AVS. No other needs to report at this time.   Fernande Boyden, LCSW Clinical Social Worker [facility] Ph: [phone]

## 2022-07-12 DIAGNOSIS — F419 Anxiety disorder, unspecified: Secondary | ICD-10-CM | POA: Diagnosis not present

## 2022-07-12 MED ORDER — CLONIDINE HCL 0.1 MG PO TABS
0.1000 mg | ORAL_TABLET | Freq: Once | ORAL | Status: AC
  Administered 2022-07-12: 0.1 mg via ORAL
  Filled 2022-07-12: qty 1

## 2022-07-12 MED ORDER — GABAPENTIN 300 MG PO CAPS
300.0000 mg | ORAL_CAPSULE | Freq: Every day | ORAL | Status: DC
  Administered 2022-07-12: 300 mg via ORAL
  Filled 2022-07-12: qty 1
  Filled 2022-07-12: qty 7

## 2022-07-12 NOTE — ED Provider Notes (Signed)
Behavioral Health Progress Note  Date and Time: 07/12/2022 10:53 PM Name: Tanya Goodman MRN:  [id number]  Subjective:  Tanya Goodman is a 37 y/o female with a psychiatric history of GAD and alcohol use disorder who presented voluntarily for help with alcohol detox, for which she was admitted to [facility].  On assessment today, patient reports feeling better overall. She is able to talk bout her family without incident. As we begin to talk about her elevated BP, she becomes overwhelemed, tearful and anxious-appearing. She denies somatic concerns today including chest pain, GI sx, nor neurologic sx. She currently denies SI, HI, and AVH. She reports that she feels ready to get back home tomorrow with mom.  We discuss starting medication for anxiety, and patient reiterates she is not interested in SSRIs nor Buspar. We discuss gabapentin at length, and she is agreeable to a trial.  Diagnosis:  Final diagnoses:  Alcohol use disorder    Total Time spent with patient: 45 minutes  Past Psychiatric History: Dx: Anxiety Current medications: Denies Past medications: Buspar - did not feel it was effect nor did she take consistently, was previously prescribed an SSRI (unsure of which one) but she did not start the medication due to having anxiety regarding the medication.  Past SI: Denies Past psychiatric hospitalizations: Denies Past Medical History: Spontaneous vaginal delivery Family Psychiatric History:  uncle has MDD, denies family hx of substance use or suicidal ideation Social History:  Living: [employer] Support: mother and 58 year old daughter       Tobacco Use   Smoking status: Every Day      Packs/day: 0.25      Years: 5.00      Additional pack years: 0.00      Total pack years: 1.25      Types: Cigarettes   Smokeless tobacco: Never  Substance Use Topics   Alcohol use: Yes   Drug use: Yes      Types: Marijuana    Additional Social History:    Pain Medications: See  MAR Prescriptions: See MAR Over the Counter: See MAR History of alcohol / drug use?: Yes Longest period of sobriety (when/how long): 3 months. Negative Consequences of Use:  (Unsure.) Withdrawal Symptoms: Nausea / Vomiting, Other (Comment) (Pt reports, her body hurts, she's depressed, her brain is shaking, body shaking.) Name of Substance 1: Alcohol. 1 - Age of First Use: 16. 1 - Amount (size/oz): Pt reports, she drank a glass of wine four hours ago. 1 - Frequency: Pt reports, she drinks everyday. 1 - Duration: Ongoing. 1 - Last Use / Amount: Per pt, four hours ago. 1 - Method of Aquiring: Purchase. 1- Route of Use: Oral.      Sleep: Fair  Appetite:  Fair  Current Medications:  Current Facility-Administered Medications  Medication Dose Route Frequency Provider Last Rate Last Admin   acetaminophen (TYLENOL) tablet 650 mg  650 mg Oral Q6H PRN Sunday Corn, NP       alum & mag hydroxide-simeth (MAALOX/MYLANTA) 200-200-20 MG/5ML suspension 30 mL  30 mL Oral Q4H PRN Sunday Corn, NP       gabapentin (NEURONTIN) capsule 300 mg  300 mg Oral QHS Lamar Sprinkles, MD   300 mg at 07/12/22 2233   LORazepam (ATIVAN) tablet 1 mg  1 mg Oral BID Sunday Corn, NP   1 mg at 07/12/22 2129   Followed by   Melene Muller ON 07/14/2022] LORazepam (ATIVAN) tablet 1 mg  1 mg Oral  Daily Sunday Corn, NP       magnesium hydroxide (MILK OF MAGNESIA) suspension 30 mL  30 mL Oral Daily PRN Sunday Corn, NP       melatonin tablet 5 mg  5 mg Oral QHS Lamar Sprinkles, MD   5 mg at 07/12/22 2129   multivitamin with minerals tablet 1 tablet  1 tablet Oral Daily Sunday Corn, NP   1 tablet at 07/12/22 0920   nicotine (NICODERM CQ - dosed in mg/24 hours) patch 14 mg  14 mg Transdermal Daily PRN Lamar Sprinkles, MD   14 mg at 07/11/22 1318   nicotine polacrilex (NICORETTE) gum 2-4 mg  2-4 mg Oral PRN Lamar Sprinkles, MD   2 mg at 07/12/22 2131   thiamine (VITAMIN B1) tablet 100 mg  100 mg Oral  Daily Erskine Emery E, NP   100 mg at 07/12/22 0920   traZODone (DESYREL) tablet 100 mg  100 mg Oral QHS PRN Lamar Sprinkles, MD       No current outpatient medications on file.    Labs  Lab Results:  Admission on 07/09/2022  Component Date Value Ref Range Status   WBC 07/09/2022 4.5  4.0 - 10.5 K/uL Final   RBC 07/09/2022 4.03  3.87 - 5.11 MIL/uL Final   Hemoglobin 07/09/2022 14.1  12.0 - 15.0 g/dL Final   HCT 78/29/5621 41.0  36.0 - 46.0 % Final   MCV 07/09/2022 101.7 (H)  80.0 - 100.0 fL Final   MCH 07/09/2022 35.0 (H)  26.0 - 34.0 pg Final   MCHC 07/09/2022 34.4  30.0 - 36.0 g/dL Final   RDW 30/86/5784 12.2  11.5 - 15.5 % Final   Platelets 07/09/2022 213  150 - 400 K/uL Final   nRBC 07/09/2022 0.0  0.0 - 0.2 % Final   Neutrophils Relative % 07/09/2022 38  % Final   Neutro Abs 07/09/2022 1.7  1.7 - 7.7 K/uL Final   Lymphocytes Relative 07/09/2022 51  % Final   Lymphs Abs 07/09/2022 2.3  0.7 - 4.0 K/uL Final   Monocytes Relative 07/09/2022 9  % Final   Monocytes Absolute 07/09/2022 0.4  0.1 - 1.0 K/uL Final   Eosinophils Relative 07/09/2022 1  % Final   Eosinophils Absolute 07/09/2022 0.0  0.0 - 0.5 K/uL Final   Basophils Relative 07/09/2022 1  % Final   Basophils Absolute 07/09/2022 0.0  0.0 - 0.1 K/uL Final   Immature Granulocytes 07/09/2022 0  % Final   Abs Immature Granulocytes 07/09/2022 0.01  0.00 - 0.07 K/uL Final   Performed at [facility], [address] [place], [place] [address]   Sodium 07/09/2022 138  135 - 145 mmol/L Final   Potassium 07/09/2022 3.7  3.5 - 5.1 mmol/L Final   Chloride 07/09/2022 100  98 - 111 mmol/L Final   CO2 07/09/2022 18 (L)  22 - 32 mmol/L Final   Glucose, Bld 07/09/2022 199 (H)  70 - 99 mg/dL Final   Glucose reference range applies only to samples taken after fasting for at least 8 hours.   BUN 07/09/2022 12  6 - 20 mg/dL Final   Creatinine, Ser 07/09/2022 0.57  0.44 - 1.00 mg/dL Final   Calcium 52/84/1324 9.6  8.9 - 10.3 mg/dL  Final   Total Protein 07/09/2022 6.9  6.5 - 8.1 g/dL Final   Albumin 40/12/2723 3.9  3.5 - 5.0 g/dL Final   AST 36/64/4034 342 (H)  15 - 41 U/L  Final   ALT 07/09/2022 177 (H)  0 - 44 U/L Final   Alkaline Phosphatase 07/09/2022 92  38 - 126 U/L Final   Total Bilirubin 07/09/2022 0.7  0.3 - 1.2 mg/dL Final   GFR, Estimated 07/09/2022 >60  >60 mL/min Final   Comment: (NOTE) Calculated using the CKD-EPI Creatinine Equation (2021)    Anion gap 07/09/2022 20 (H)  5 - 15 Final   Comment: ELECTROLYTES REPEATED TO VERIFY Performed at [facility], [address] [place], [place] [address]    Hgb A1c MFr Bld 07/09/2022 5.2  4.8 - 5.6 % Final   Comment: (NOTE) Pre diabetes:          5.7%-6.4%  Diabetes:              >6.4%  Glycemic control for   <7.0% adults with diabetes    Mean Plasma Glucose 07/09/2022 102.54  mg/dL Final   Performed at [facility], [address] [place], [place] [address]   Magnesium 07/09/2022 1.9  1.7 - 2.4 mg/dL Final   Performed at [facility], [address] [place], [place] [address]   Alcohol, Ethyl (B) 07/09/2022 268 (H)  <10 mg/dL Final   Comment: (NOTE) Lowest detectable limit for serum alcohol is 10 mg/dL.  For medical purposes only. Performed at [facility], [address] [place], [place] [address]    Cholesterol 07/09/2022 328 (H)  0 - 200 mg/dL Final   Triglycerides 29/56/2130 191 (H)  <150 mg/dL Final   HDL 86/57/8469 >135  >40 mg/dL Final   Total CHOL/HDL Ratio 07/09/2022 NOT CALCULATED  RATIO Final   VLDL 07/09/2022 38  0 - 40 mg/dL Final   LDL Cholesterol 07/09/2022 NOT CALCULATED  0 - 99 mg/dL Final   Performed at [facility], [address], [place], [place] [address]   TSH 07/09/2022 1.581  0.350 - 4.500 uIU/mL Final   Comment: Performed by a 3rd Generation assay with a functional sensitivity of <=0.01 uIU/mL. Performed at [facility], [address], [place], [place] [address]    Prolactin  07/09/2022 11.0  4.8 - 33.4 ng/mL Final   Comment: (NOTE) Performed At: [facility] [address] [place], [place] [id number] Jolene Schimke MD DG:[id number]    Preg Test, Ur 07/09/2022 Negative  Negative Final   POC Amphetamine UR 07/09/2022 None Detected  NONE DETECTED (Cut Off Level 1000 ng/mL) Final   POC Secobarbital (BAR) 07/09/2022 None Detected  NONE DETECTED (Cut Off Level 300 ng/mL) Final   POC Buprenorphine (BUP) 07/09/2022 None Detected  NONE DETECTED (Cut Off Level 10 ng/mL) Final   POC Oxazepam (BZO) 07/09/2022 Positive (A)  NONE DETECTED (Cut Off Level 300 ng/mL) Final   POC Cocaine UR 07/09/2022 None Detected  NONE DETECTED (Cut Off Level 300 ng/mL) Final   POC Methamphetamine UR 07/09/2022 None Detected  NONE DETECTED (Cut Off Level 1000 ng/mL) Final   POC Morphine 07/09/2022 None Detected  NONE DETECTED (Cut Off Level 300 ng/mL) Final   POC Methadone UR 07/09/2022 None Detected  NONE DETECTED (Cut Off Level 300 ng/mL) Final   POC Oxycodone UR 07/09/2022 None Detected  NONE DETECTED (Cut Off Level 100 ng/mL) Final   POC Marijuana UR 07/09/2022 Positive (A)  NONE DETECTED (Cut Off Level 50 ng/mL) Final   SARSCOV2ONAVIRUS 2 AG 07/09/2022 NEGATIVE  NEGATIVE Final   Comment: (NOTE) SARS-CoV-2 antigen NOT DETECTED.   Negative results are  presumptive.  Negative results do not preclude SARS-CoV-2 infection and should not be used as the sole basis for treatment or other patient management decisions, including infection  control decisions, particularly in the presence of clinical signs and  symptoms consistent with COVID-19, or in those who have been in contact with the virus.  Negative results must be combined with clinical observations, patient history, and epidemiological information. The expected result is Negative.  Fact Sheet for Patients: https://www.jennings-kim.com/  Fact Sheet for Healthcare  Providers: https://alexander-rogers.biz/  This test is not yet approved or cleared by the [place] FDA and  has been authorized for detection and/or diagnosis of SARS-CoV-2 by FDA under an Emergency Use Authorization (EUA).  This EUA will remain in effect (meaning this test can be used) for the duration of  the COV                          ID-19 declaration under Section 564(b)(1) of the Act, 21 U.S.C. section 360bbb-3(b)(1), unless the authorization is terminated or revoked sooner.     Preg Test, Ur 07/09/2022 NEGATIVE  NEGATIVE Final   Comment:        THE SENSITIVITY OF THIS METHODOLOGY IS >24 mIU/mL    SARS Coronavirus 2 by RT PCR 07/09/2022 NEGATIVE  NEGATIVE Final   Performed at [facility], [address], [place], [place] [address]    Blood Alcohol level:  Lab Results  Component Value Date   ETH 268 (H) 07/09/2022    Metabolic Disorder Labs: Lab Results  Component Value Date   HGBA1C 5.2 07/09/2022   MPG 102.54 07/09/2022   Lab Results  Component Value Date   PROLACTIN 11.0 07/09/2022   Lab Results  Component Value Date   CHOL 328 (H) 07/09/2022   TRIG 191 (H) 07/09/2022   HDL >135 07/09/2022   CHOLHDL NOT CALCULATED 07/09/2022   VLDL 38 07/09/2022   LDLCALC NOT CALCULATED 07/09/2022    Therapeutic Lab Levels: No results found for: "LITHIUM" No results found for: "VALPROATE" No results found for: "CBMZ"  Physical Findings   PHQ2-9    Flowsheet Row ED from 07/09/2022 in [facility]  PHQ-2 Total Score 5  PHQ-9 Total Score 20      Flowsheet Row ED from 07/09/2022 in [facility]  C-SSRS RISK CATEGORY No Risk        Musculoskeletal  Strength & Muscle Tone: within normal limits Gait & Station: normal Patient leans: N/A  Psychiatric Specialty Exam  Presentation  General Appearance:  Casual  Eye Contact: Good  Speech: Clear and Coherent; Normal Rate  Speech  Volume: Normal  Handedness: Right   Mood and Affect  Mood: Anxious  Affect: Congruent   Thought Process  Thought Processes: Coherent; Linear  Descriptions of Associations:Intact  Orientation:Full (Time, Place and Person)  Thought Content:Logical; WDL  Diagnosis of Schizophrenia or Schizoaffective disorder in past: No    Hallucinations:Hallucinations: None   Ideas of Reference:None  Suicidal Thoughts:Suicidal Thoughts: No   Homicidal Thoughts:Homicidal Thoughts: No    Sensorium  Memory: Immediate Good; Recent Good  Judgment: Fair  Insight: Good   Executive Functions  Concentration: Good  Attention Span: Good  Recall: Good  Fund of Knowledge: Good  Language: Good   Psychomotor Activity  Psychomotor Activity: Psychomotor Activity: Normal    Assets  Assets: Communication Skills; Desire for Improvement; Social Support   Sleep  Sleep: Sleep: Good  No data recorded   Physical Exam  Physical Exam  General: Pleasant, well-appearing female in bed. No acute distress. CV: RRR. No murmurs, rubs, or gallops. No LE edema Pulmonary: Lungs CTAB. Normal effort. No wheezing or rales. Abdominal: Soft, nontender, nondistended. Normal bowel sounds. Extremities: Radial and DP pulses 2+ and symmetric. Normal ROM. Skin: Warm and dry. No obvious rash or lesions. Neuro: A&Ox3. Moves all extremities. Normal sensation. No focal deficit. Psych: Normal mood and affect   Review of Systems  Constitutional: Negative.  Negative for chills, fever and weight loss.   Respiratory: Negative.    Cardiovascular: Negative.   Gastrointestinal:  Negative for constipation, diarrhea, nausea and vomiting.  Genitourinary: Negative.   Musculoskeletal: Negative.   Skin: Negative.   Neurological: Negative.  Negative for tingling.     Blood pressure (!) 121/90, pulse (!) 104, temperature 98.2 F (36.8 C), temperature source Oral, resp. rate 19, SpO2 99 %.  There is no height or weight on file to calculate BMI.  Treatment Plan Summary: Daily contact with patient to assess and evaluate symptoms and progress in treatment, Medication management, and Plan     AUD - Continue CIWA protocol for monitoring of withdrawal with po thiamine and MVI replacement, Ativan taper, and Ativan 1mg  for scores >10  GAD - START Gabapentin 300 mg qHS for anxiety and alcohol cravings. - Hydroxyzine PRN available.   Elevated BP without HTN dx BP in 130s/100s. Given 1 dose of clonidine 0.1 mg. EKG obtained= sinus tachycardia. After clonidine, dec to 110s/90s. Likely 2/2 anxiety, as patient also tachycardic but otherwise asx. Patient afebrile.  - Continue to monitor. - Advise PCP follow-up   Dispo: Home with outpatient follow-up on 4/13 at 11 AM. Appointment with [facility] on 4/16 at 8 AM.   Lamar Sprinkles, MD 07/12/2022 10:53 PM

## 2022-07-12 NOTE — Group Note (Signed)
Group Topic: Relaxation  Group Date: 07/12/2022 Start Time: 1100 End Time: 1150 Facilitators: Jenean Lindau, RN  Department: [facility]  Number of Participants: 4  Group Focus: anxiety, relapse prevention, and relaxation Treatment Modality:  Psychoeducation Interventions utilized were patient education and problem solving Purpose: enhance coping skills, explore maladaptive thinking, express feelings, express irrational fears, increase insight, and relapse prevention strategies  Name: Tanya Goodman Date of Birth: 12-18-1985  MR: [id number]    Level of Participation: when cued Quality of Participation: cooperative Interactions with others: gave feedback Mood/Affect: anxious Triggers (if applicable): n/a Cognition: concrete Progress: Gaining insight Response: n/a Plan: patient will be encouraged to attend future groups  Patients Problems:  Patient Active Problem List   Diagnosis Date Noted   Alcohol use disorder 07/09/2022

## 2022-07-12 NOTE — ED Notes (Signed)
D/c home 4/13 with outpt f/u at [facility] on Tuesday

## 2022-07-12 NOTE — ED Notes (Signed)
Patient alert and oriented x 3. Denies SI/HI/AVH. Denies intent or plan to harm self or others. Routine conducted according to faculty protocol. Encourage patient to notify staff with any needs or concerns. Patient verbalized agreement and understanding. Will continue to monitor for safety. 

## 2022-07-12 NOTE — ED Notes (Signed)
Patient is sleeping. Respirations equal and unlabored, skin warm and dry. No change in assessment or acuity. Routine safety checks conducted according to facility protocol. Will continue to monitor for safety.   

## 2022-07-12 NOTE — ED Notes (Addendum)
SPIRITUALITY GROUP NOTE  Spirituality group facilitated by Wilkie Aye, MDiv, BCC.  Group Description: Group focused on topic of hope. Patients participated in facilitated discussion around topic, connecting with one another around experiences and definitions for hope. Group members engaged with visual explorer photos, reflecting on what hope looks like for them today. Group engaged in discussion around how their definitions of hope are present today in hospital.  Modalities: Psycho-social ed, Adlerian, Narrative, MI  Patient Progress: Tanya Goodman was engaged in group process.  Arrived late to group d/t speaking with physician.  Connected with other patients around practices of self care and noted that cleaning her space was a practice of hope for her.

## 2022-07-12 NOTE — ED Notes (Signed)
Patient in room. Environment is secured. Will continue to monitor for safety. 

## 2022-07-12 NOTE — ED Notes (Signed)
Patient is watching tv in the dayroom. Environment secured. Alert and oriented. Will continue to monitor for safety. 

## 2022-07-12 NOTE — ED Notes (Signed)
Patient blood pressure was 131/102 and pulse 101 . Denies any s/s of hypertension. Will notify provider. Patient is breathing non labored.

## 2022-07-12 NOTE — Discharge Planning (Addendum)
LCSW attempted to follow up with multiple facilities regarding scheduling an aftercare appt, however received no answer. LCSW attempted to follow up with [facility] [place], however was provided walk in hours for new patient services. Information has been added in AVS for patient's review. L[facility] contacted [facility] and patient has been scheduled a aftercare follow up appt on Tuesday at 8:00am with Les to be evaluated for therapy and medication management. Agency also provided intensive outpatient services. Information provided in AVS. LCSW spoke with patient to inform. No other concerns were reported at this time.   LCSW will continue to follow and provide support to patient while on [facility] unit.   Fernande Boyden, LCSW Clinical Social Worker [facility] Ph: [phone]

## 2022-07-12 NOTE — ED Notes (Signed)
Patients blood pressure was 135/103.pulse 113. Denies any s/s of hypertension. Notified provider. Will continue to monitor for safety.

## 2022-07-12 NOTE — ED Notes (Signed)
Patient was provided with lunch 

## 2022-07-12 NOTE — ED Notes (Signed)
Pt is sleeping. No distress noted. Will continue to monitor for safety. 

## 2022-07-12 NOTE — ED Notes (Signed)
Pt is in the dayroom watching TV with peers. Pt denies SI/HI/AVH. No acute distress noted. Will continue to monitor for safety. 

## 2022-07-12 NOTE — ED Notes (Signed)
Patients blood pressure was elevated. Denies any s/s of hypertension. Patients pulse was elevated also. 134/102 pulse 138. Patient is breathing non labored. Will continue to monitor for safety. Rn notified provider.

## 2022-07-12 NOTE — ED Notes (Signed)
Patient is in the group room with staff having a group meeting. Alert,will continue to monitor for safety. 

## 2022-07-12 NOTE — ED Notes (Signed)
Patient was provided with dinner 

## 2022-07-13 DIAGNOSIS — F419 Anxiety disorder, unspecified: Secondary | ICD-10-CM | POA: Diagnosis not present

## 2022-07-13 MED ORDER — VITAMIN B-1 100 MG PO TABS: 100.0000 mg | ORAL_TABLET | Freq: Every day | ORAL | 0 refills | Status: DC

## 2022-07-13 MED ORDER — MELATONIN 5 MG PO TABS: 5.0000 mg | ORAL_TABLET | Freq: Every day | ORAL | 0 refills | Status: DC

## 2022-07-13 MED ORDER — GABAPENTIN 300 MG PO CAPS: 300.0000 mg | ORAL_CAPSULE | Freq: Every day | ORAL | 0 refills | Status: DC

## 2022-07-13 MED ORDER — NICOTINE 14 MG/24HR TD PT24: 14.0000 mg | MEDICATED_PATCH | Freq: Every day | TRANSDERMAL | 0 refills | Status: DC | PRN

## 2022-07-13 MED ORDER — HYDROXYZINE HCL 25 MG PO TABS: 25.0000 mg | ORAL_TABLET | Freq: Three times a day (TID) | ORAL | 0 refills | Status: DC | PRN

## 2022-07-13 NOTE — ED Notes (Signed)
Pt is sleeping. No distress noted. Will continue to monitor for safety. 

## 2022-07-13 NOTE — ED Provider Notes (Signed)
FBC/OBS ASAP Discharge Summary  Date and Time: 07/13/2022 10:53 AM  Name: Tanya Goodman  MRN:  [id number]   Discharge Diagnoses:  Final diagnoses:  Alcohol use disorder    Subjective: Tanya Goodman is a 37 y/o female with a psychiatric history of GAD and alcohol use disorder who presented voluntarily for help with alcohol detox, for which she was admitted to [facility].    Stay Summary:   Patient was placed on an Ativan taper, which she faired well with. Patient had no withdrawal complication and no behaviors events on the unit. She was noted to be engaged in therapy on the unit. She did endorse significant anxiety that worsened with sobriety. Discussion was had with patient about SSRI or buspar; however she did not wish to be on these medications but was willing to start gabapentin. She was provided a QHS dose which significantly helped with sleep and some anxiety. She continues to be worried about her anxiety long term, but felt more confident with her OPT care being set up and the opportunity for medication adjustments in outpatient. By discharge, patient was denying cravings for Etoh on BZDs, she was worried that she may want to use Xanax if she were to have a panic attack. She as able to endorse new coping skill such as square breathing that have been helpful for her anxiety thus far and decrease risk of BZD relapse. She also endorsed finding Hydroxyzine helpful.   She denies SI, HI, and AVH for at least the last 48h.   Total Time spent with patient: 20 minutes Past Psychiatric History: Dx: Anxiety Current medications: Denies Past medications: Buspar - did not feel it was effect nor did she take consistently, was previously prescribed an SSRI (unsure of which one) but she did not start the medication due to having anxiety regarding the medication.  Past SI: Denies Past psychiatric hospitalizations: Denies Past Medical History: Spontaneous vaginal delivery Family Psychiatric History:  uncle  has MDD, denies family hx of substance use or suicidal ideation Social History:  Living: [employer] Support: mother and 46 year old daughter Tobacco Cessation:  A prescription for an FDA-approved tobacco cessation medication provided at discharge  Current Medications:  Current Facility-Administered Medications  Medication Dose Route Frequency Provider Last Rate Last Admin   acetaminophen (TYLENOL) tablet 650 mg  650 mg Oral Q6H PRN Sunday Corn, NP       alum & mag hydroxide-simeth (MAALOX/MYLANTA) 200-200-20 MG/5ML suspension 30 mL  30 mL Oral Q4H PRN Sunday Corn, NP       gabapentin (NEURONTIN) capsule 300 mg  300 mg Oral QHS Lamar Sprinkles, MD   300 mg at 07/12/22 2233   [START ON 07/14/2022] LORazepam (ATIVAN) tablet 1 mg  1 mg Oral Daily Erskine Emery E, NP       magnesium hydroxide (MILK OF MAGNESIA) suspension 30 mL  30 mL Oral Daily PRN Sunday Corn, NP       melatonin tablet 5 mg  5 mg Oral QHS Lamar Sprinkles, MD   5 mg at 07/12/22 2129   multivitamin with minerals tablet 1 tablet  1 tablet Oral Daily Sunday Corn, NP   1 tablet at 07/13/22 0910   nicotine (NICODERM CQ - dosed in mg/24 hours) patch 14 mg  14 mg Transdermal Daily PRN Lamar Sprinkles, MD   14 mg at 07/11/22 1318   nicotine polacrilex (NICORETTE) gum 2-4 mg  2-4 mg Oral PRN Lamar Sprinkles, MD   2  mg at 07/12/22 2131   thiamine (VITAMIN B1) tablet 100 mg  100 mg Oral Daily Sunday Corn, NP   100 mg at 07/13/22 0910   traZODone (DESYREL) tablet 100 mg  100 mg Oral QHS PRN Lamar Sprinkles, MD       Current Outpatient Medications  Medication Sig Dispense Refill   hydrOXYzine (ATARAX) 25 MG tablet Take 1 tablet (25 mg total) by mouth 3 (three) times daily as needed for anxiety. 90 tablet 0   gabapentin (NEURONTIN) 300 MG capsule Take 1 capsule (300 mg total) by mouth at bedtime. 30 capsule 0   melatonin 5 MG TABS Take 1 tablet (5 mg total) by mouth at bedtime. 30 tablet 0   nicotine  (NICODERM CQ - DOSED IN MG/24 HOURS) 14 mg/24hr patch Place 1 patch (14 mg total) onto the skin daily as needed (smoking cessation). 28 patch 0   [START ON 07/14/2022] thiamine (VITAMIN B-1) 100 MG tablet Take 1 tablet (100 mg total) by mouth daily. 30 tablet 0    PTA Medications:  Facility Ordered Medications  Medication   acetaminophen (TYLENOL) tablet 650 mg   alum & mag hydroxide-simeth (MAALOX/MYLANTA) 200-200-20 MG/5ML suspension 30 mL   magnesium hydroxide (MILK OF MAGNESIA) suspension 30 mL   [COMPLETED] thiamine (VITAMIN B1) injection 100 mg   thiamine (VITAMIN B1) tablet 100 mg   multivitamin with minerals tablet 1 tablet   [EXPIRED] LORazepam (ATIVAN) tablet 1 mg   [EXPIRED] hydrOXYzine (ATARAX) tablet 25 mg   [EXPIRED] loperamide (IMODIUM) capsule 2-4 mg   [EXPIRED] ondansetron (ZOFRAN-ODT) disintegrating tablet 4 mg   [COMPLETED] LORazepam (ATIVAN) tablet 1 mg   Followed by   [COMPLETED] LORazepam (ATIVAN) tablet 1 mg   Followed by   [COMPLETED] LORazepam (ATIVAN) tablet 1 mg   Followed by   Melene Muller ON 07/14/2022] LORazepam (ATIVAN) tablet 1 mg   [COMPLETED] calcium carbonate (TUMS - dosed in mg elemental calcium) chewable tablet 200 mg of elemental calcium   [COMPLETED] nicotine (NICODERM CQ - dosed in mg/24 hours) patch 14 mg   [COMPLETED] nicotine polacrilex (NICORETTE) gum 2 mg   nicotine polacrilex (NICORETTE) gum 2-4 mg   nicotine (NICODERM CQ - dosed in mg/24 hours) patch 14 mg   traZODone (DESYREL) tablet 100 mg   melatonin tablet 5 mg   [COMPLETED] cloNIDine (CATAPRES) tablet 0.1 mg   gabapentin (NEURONTIN) capsule 300 mg   PTA Medications  Medication Sig   hydrOXYzine (ATARAX) 25 MG tablet Take 1 tablet (25 mg total) by mouth 3 (three) times daily as needed for anxiety.   melatonin 5 MG TABS Take 1 tablet (5 mg total) by mouth at bedtime.   gabapentin (NEURONTIN) 300 MG capsule Take 1 capsule (300 mg total) by mouth at bedtime.   [START ON 07/14/2022]  thiamine (VITAMIN B-1) 100 MG tablet Take 1 tablet (100 mg total) by mouth daily.   nicotine (NICODERM CQ - DOSED IN MG/24 HOURS) 14 mg/24hr patch Place 1 patch (14 mg total) onto the skin daily as needed (smoking cessation).       07/13/2022   10:52 AM 07/09/2022    7:53 PM  Depression screen PHQ 2/9  Decreased Interest 1 2  Down, Depressed, Hopeless 2 3  PHQ - 2 Score 3 5  Altered sleeping 3 3  Tired, decreased energy 3 3  Change in appetite 3 3  Feeling bad or failure about yourself  2 3  Trouble concentrating 1 1  Moving slowly or fidgety/restless  1 1  Suicidal thoughts 0 1  PHQ-9 Score 16 20  Difficult doing work/chores Very difficult Extremely dIfficult    Flowsheet Row ED from 07/09/2022 in [facility]  C-SSRS RISK CATEGORY No Risk       Musculoskeletal  Strength & Muscle Tone: within normal limits Gait & Station: normal Patient leans: N/A  Psychiatric Specialty Exam  Presentation  General Appearance:  Casual  Eye Contact: Good  Speech: Clear and Coherent  Speech Volume: Normal  Handedness: Right   Mood and Affect  Mood: Anxious  Affect: Congruent   Thought Process  Thought Processes: Coherent  Descriptions of Associations:Intact  Orientation:Full (Time, Place and Person)  Thought Content:Logical  Diagnosis of Schizophrenia or Schizoaffective disorder in past: No    Hallucinations:Hallucinations: None  Ideas of Reference:None  Suicidal Thoughts:Suicidal Thoughts: No  Homicidal Thoughts:Homicidal Thoughts: No   Sensorium  Memory: Immediate Fair; Recent Fair  Judgment: Good  Insight: Fair   Art therapist  Concentration: Fair  Attention Span: Fair  Recall: Good  Fund of Knowledge: Fair  Language: Good   Psychomotor Activity  Psychomotor Activity: Psychomotor Activity: Normal   Assets  Assets: Desire for Improvement; Housing; Resilience; Social Support   Sleep   Sleep: Sleep: Fair   No data recorded  Physical Exam  Physical Exam HENT:     Head: Normocephalic and atraumatic.  Pulmonary:     Effort: Pulmonary effort is normal.  Neurological:     Mental Status: She is alert and oriented to person, place, and time.    Review of Systems  Psychiatric/Behavioral:  Negative for hallucinations and suicidal ideas. The patient is nervous/anxious.    Blood pressure (!) 143/89, pulse 85, temperature 98.4 F (36.9 C), resp. rate 18, SpO2 100 %. There is no height or weight on file to calculate BMI.  Demographic Factors:  Caucasian and Low socioeconomic status  Loss Factors: NA  Historical Factors: NA  Risk Reduction Factors:   Positive social support  Continued Clinical Symptoms:  Severe Anxiety and/or Agitation  Cognitive Features That Contribute To Risk:  None    Suicide Risk:  Minimal: No identifiable suicidal ideation.  Patients presenting with no risk factors but with morbid ruminations; may be classified as minimal risk based on the severity of the depressive symptoms  Plan Of Care/Follow-up recommendations:  Follow up recommendations: - Activity as tolerated. - Diet as recommended by PCP. - Keep all scheduled follow-up appointments as recommended.   Disposition: Home by mom w/ f/u up at [facility] on 4/16 at 8AM for assessment for opt services  Etoh use d/o, severe GAD - Continue gabapentin 300mg  QHS - Continue melatonin 5mg  QHS - Continue Hydroxyzine 25mg  TID PRN  Patient instructed to not operate a vehicle after use if medication is too sedating   Tobacco use d/o, moderate - Restart Nicotine patch 14mg  daily   PGY-3 Bobbye Morton, MD 07/13/2022, 10:53 AM

## 2022-07-13 NOTE — Progress Notes (Signed)
Pt is awake, alert and oriented X4. Pt did not voice any complaints of pain or discomfort. No distress noted. Pt denies current SI/HI/AVH, plan or intent. Staff will monitor for pt's safety. 

## 2022-10-29 ENCOUNTER — Encounter (HOSPITAL_BASED_OUTPATIENT_CLINIC_OR_DEPARTMENT_OTHER): Payer: Self-pay

## 2022-10-29 ENCOUNTER — Emergency Department (HOSPITAL_BASED_OUTPATIENT_CLINIC_OR_DEPARTMENT_OTHER): Payer: Medicaid Other | Admitting: Radiology

## 2022-10-29 ENCOUNTER — Inpatient Hospital Stay (HOSPITAL_BASED_OUTPATIENT_CLINIC_OR_DEPARTMENT_OTHER)
Admission: EM | Admit: 2022-10-29 | Discharge: 2022-11-07 | DRG: 433 | Disposition: A | Discharge status: Home / Self Care | Payer: Medicaid Other | Attending: Internal Medicine | Admitting: Internal Medicine

## 2022-10-29 ENCOUNTER — Emergency Department (HOSPITAL_BASED_OUTPATIENT_CLINIC_OR_DEPARTMENT_OTHER): Payer: Medicaid Other

## 2022-10-29 ENCOUNTER — Other Ambulatory Visit: Payer: Self-pay

## 2022-10-29 DIAGNOSIS — K7210 Chronic hepatic failure without coma: Secondary | ICD-10-CM | POA: Diagnosis present

## 2022-10-29 DIAGNOSIS — Z79899 Other long term (current) drug therapy: Secondary | ICD-10-CM

## 2022-10-29 DIAGNOSIS — Y906 Blood alcohol level of 120-199 mg/100 ml: Secondary | ICD-10-CM | POA: Diagnosis present

## 2022-10-29 DIAGNOSIS — F1021 Alcohol dependence, in remission: Secondary | ICD-10-CM

## 2022-10-29 DIAGNOSIS — K529 Noninfective gastroenteritis and colitis, unspecified: Secondary | ICD-10-CM | POA: Diagnosis present

## 2022-10-29 DIAGNOSIS — F1090 Alcohol use, unspecified, uncomplicated: Secondary | ICD-10-CM | POA: Diagnosis present

## 2022-10-29 DIAGNOSIS — E669 Obesity, unspecified: Secondary | ICD-10-CM | POA: Diagnosis present

## 2022-10-29 DIAGNOSIS — E876 Hypokalemia: Secondary | ICD-10-CM

## 2022-10-29 DIAGNOSIS — D649 Anemia, unspecified: Secondary | ICD-10-CM | POA: Diagnosis present

## 2022-10-29 DIAGNOSIS — E538 Deficiency of other specified B group vitamins: Secondary | ICD-10-CM | POA: Diagnosis present

## 2022-10-29 DIAGNOSIS — F17210 Nicotine dependence, cigarettes, uncomplicated: Secondary | ICD-10-CM | POA: Diagnosis present

## 2022-10-29 DIAGNOSIS — K760 Fatty (change of) liver, not elsewhere classified: Secondary | ICD-10-CM | POA: Diagnosis present

## 2022-10-29 DIAGNOSIS — F1020 Alcohol dependence, uncomplicated: Secondary | ICD-10-CM

## 2022-10-29 DIAGNOSIS — D539 Nutritional anemia, unspecified: Secondary | ICD-10-CM | POA: Diagnosis present

## 2022-10-29 DIAGNOSIS — R000 Tachycardia, unspecified: Secondary | ICD-10-CM | POA: Diagnosis not present

## 2022-10-29 DIAGNOSIS — D689 Coagulation defect, unspecified: Secondary | ICD-10-CM | POA: Diagnosis present

## 2022-10-29 DIAGNOSIS — E66811 Obesity, class 1: Secondary | ICD-10-CM

## 2022-10-29 DIAGNOSIS — K7031 Alcoholic cirrhosis of liver with ascites: Secondary | ICD-10-CM | POA: Diagnosis not present

## 2022-10-29 DIAGNOSIS — K7011 Alcoholic hepatitis with ascites: Principal | ICD-10-CM | POA: Diagnosis present

## 2022-10-29 DIAGNOSIS — E871 Hypo-osmolality and hyponatremia: Secondary | ICD-10-CM | POA: Diagnosis present

## 2022-10-29 DIAGNOSIS — R768 Other specified abnormal immunological findings in serum: Secondary | ICD-10-CM | POA: Diagnosis present

## 2022-10-29 DIAGNOSIS — K7010 Alcoholic hepatitis without ascites: Secondary | ICD-10-CM | POA: Diagnosis present

## 2022-10-29 DIAGNOSIS — Z6832 Body mass index (BMI) 32.0-32.9, adult: Secondary | ICD-10-CM

## 2022-10-29 HISTORY — DX: Alcohol use, unspecified, uncomplicated: F10.90

## 2022-10-29 LAB — COMPREHENSIVE METABOLIC PANEL
ALT: 169 U/L — ABNORMAL HIGH (ref 0–44)
AST: 726 U/L — ABNORMAL HIGH (ref 15–41)
Albumin: 3.2 g/dL — ABNORMAL LOW (ref 3.5–5.0)
Alkaline Phosphatase: 373 U/L — ABNORMAL HIGH (ref 38–126)
Anion gap: 18 — ABNORMAL HIGH (ref 5–15)
BUN: 5 mg/dL — ABNORMAL LOW (ref 6–20)
CO2: 23 mmol/L (ref 22–32)
Calcium: 8.8 mg/dL — ABNORMAL LOW (ref 8.9–10.3)
Chloride: 87 mmol/L — ABNORMAL LOW (ref 98–111)
Creatinine, Ser: 0.54 mg/dL (ref 0.44–1.00)
GFR, Estimated: >60 mL/min (ref >60)
Glucose, Bld: 93 mg/dL (ref 70–99)
Potassium: 3.2 mmol/L — ABNORMAL LOW (ref 3.5–5.1)
Sodium: 128 mmol/L — ABNORMAL LOW (ref 135–145)
Total Bilirubin: 28.4 mg/dL (ref 0.3–1.2)
Total Protein: 6.4 g/dL — ABNORMAL LOW (ref 6.5–8.1)

## 2022-10-29 LAB — URINALYSIS, ROUTINE W REFLEX MICROSCOPIC
Glucose, UA: NEGATIVE mg/dL
Hgb urine dipstick: NEGATIVE
Ketones, ur: NEGATIVE mg/dL
Leukocytes,Ua: NEGATIVE
Nitrite: NEGATIVE
Specific Gravity, Urine: 1.015 (ref 1.005–1.030)
pH: 6.5 (ref 5.0–8.0)

## 2022-10-29 LAB — LIPASE, BLOOD: Lipase: 74 U/L — ABNORMAL HIGH (ref 11–51)

## 2022-10-29 LAB — TROPONIN I (HIGH SENSITIVITY)
Troponin I (High Sensitivity): 11 ng/L (ref <18)
Troponin I (High Sensitivity): 9 ng/L (ref <18)

## 2022-10-29 LAB — CBC
HCT: 36.1 % (ref 36.0–46.0)
Hemoglobin: 12.8 g/dL (ref 12.0–15.0)
MCH: 36.3 pg — ABNORMAL HIGH (ref 26.0–34.0)
MCHC: 35.5 g/dL (ref 30.0–36.0)
MCV: 102.3 fL — ABNORMAL HIGH (ref 80.0–100.0)
Platelets: 205 10*3/uL (ref 150–400)
RBC: 3.53 MIL/uL — ABNORMAL LOW (ref 3.87–5.11)
RDW: 18.5 % — ABNORMAL HIGH (ref 11.5–15.5)
WBC: 5.9 10*3/uL (ref 4.0–10.5)
nRBC: 0 % (ref 0.0–0.2)

## 2022-10-29 LAB — PREGNANCY, URINE: Preg Test, Ur: NEGATIVE

## 2022-10-29 LAB — PROTIME-INR
INR: 1.4 — ABNORMAL HIGH (ref 0.8–1.2)
Prothrombin Time: 17.1 seconds — ABNORMAL HIGH (ref 11.4–15.2)

## 2022-10-29 MED ORDER — THIAMINE MONONITRATE 100 MG PO TABS
100.0000 mg | ORAL_TABLET | Freq: Every day | ORAL | Status: DC
  Administered 2022-10-30 – 2022-11-07 (×9): 100 mg via ORAL
  Filled 2022-10-29 (×9): qty 1

## 2022-10-29 MED ORDER — LACTATED RINGERS IV BOLUS
500.0000 mL | Freq: Once | INTRAVENOUS | Status: AC
  Administered 2022-10-29: 500 mL via INTRAVENOUS

## 2022-10-29 MED ORDER — LORAZEPAM 1 MG PO TABS: 1.0000 mg | ORAL_TABLET | ORAL | Status: DC | PRN

## 2022-10-29 MED ORDER — LORAZEPAM 2 MG/ML IJ SOLN
1.0000 mg | INTRAMUSCULAR | Status: DC | PRN
  Administered 2022-10-30: 1 mg via INTRAVENOUS
  Filled 2022-10-29: qty 1

## 2022-10-29 MED ORDER — IOHEXOL 300 MG/ML  SOLN
100.0000 mL | Freq: Once | INTRAMUSCULAR | Status: AC | PRN
  Administered 2022-10-29: 80 mL via INTRAVENOUS

## 2022-10-29 MED ORDER — THIAMINE HCL 100 MG/ML IJ SOLN: 100.0000 mg | Freq: Every day | INTRAMUSCULAR | Status: DC

## 2022-10-29 MED ORDER — LORAZEPAM 2 MG/ML IJ SOLN
1.0000 mg | Freq: Once | INTRAMUSCULAR | Status: AC
  Administered 2022-10-29: 1 mg via INTRAVENOUS
  Filled 2022-10-29: qty 1

## 2022-10-29 MED ORDER — POTASSIUM CHLORIDE CRYS ER 20 MEQ PO TBCR
40.0000 meq | EXTENDED_RELEASE_TABLET | Freq: Once | ORAL | Status: AC
  Administered 2022-10-29: 40 meq via ORAL
  Filled 2022-10-29: qty 2

## 2022-10-29 MED ORDER — NICOTINE 21 MG/24HR TD PT24
21.0000 mg | MEDICATED_PATCH | Freq: Once | TRANSDERMAL | Status: AC
  Administered 2022-10-30: 21 mg via TRANSDERMAL
  Filled 2022-10-29: qty 1

## 2022-10-29 MED ORDER — ADULT MULTIVITAMIN W/MINERALS CH
1.0000 | ORAL_TABLET | Freq: Every day | ORAL | Status: DC
  Administered 2022-10-30: 1 via ORAL
  Filled 2022-10-29: qty 1

## 2022-10-29 MED ORDER — FOLIC ACID 1 MG PO TABS
1.0000 mg | ORAL_TABLET | Freq: Every day | ORAL | Status: DC
  Administered 2022-10-30 – 2022-11-07 (×9): 1 mg via ORAL
  Filled 2022-10-29 (×9): qty 1

## 2022-10-29 NOTE — ED Triage Notes (Signed)
Pt presents to triage with c/o abdominal pain, n/v, swollen legs and feet, severe fatigue, abnormal bowel movements. Jaundice noted in eyes and upper body.

## 2022-10-29 NOTE — ED Provider Notes (Signed)
[place] EMERGENCY DEPARTMENT AT [facility] Provider Note   CSN: [id number] Arrival date & time: 10/29/22  1847     History  No chief complaint on file.   Tanya Goodman is a 37 y.o. female.  HPI Patient presents for multiple complaints.  Medical history includes alcohol abuse.  Alcohol abuse is ongoing.  Patient states that she drinks approximately 1/5 of liquor per day.  Last drink was today around midday.  Over the past month, patient has had abdominal swelling, abdominal discomfort, nausea, vomiting, extremity swelling, and yellow discoloration of skin.  She denies any current nausea.  She endorses fatigue and mild abdominal discomfort.  She is accompanied by her mother who states that jaundice has worsened over the past week.    Home Medications Prior to Admission medications   Medication Sig Start Date End Date Taking? Authorizing Provider  gabapentin (NEURONTIN) 300 MG capsule Take 1 capsule (300 mg total) by mouth at bedtime. 07/13/22   Bobbye Morton, MD  hydrOXYzine (ATARAX) 25 MG tablet Take 1 tablet (25 mg total) by mouth 3 (three) times daily as needed for anxiety. 07/13/22   Bobbye Morton, MD  melatonin 5 MG TABS Take 1 tablet (5 mg total) by mouth at bedtime. 07/13/22   Bobbye Morton, MD  nicotine (NICODERM CQ - DOSED IN MG/24 HOURS) 14 mg/24hr patch Place 1 patch (14 mg total) onto the skin daily as needed (smoking cessation). 07/13/22   Bobbye Morton, MD  thiamine (VITAMIN B-1) 100 MG tablet Take 1 tablet (100 mg total) by mouth daily. 07/14/22   Bobbye Morton, MD      Allergies    Patient has no known allergies.    Review of Systems   Review of Systems  Constitutional:  Positive for activity change, appetite change and fatigue.  Respiratory:  Positive for shortness of breath.   Cardiovascular:  Positive for leg swelling.  Gastrointestinal:  Positive for abdominal distention, abdominal pain, nausea and vomiting.  Skin:  Positive for color  change.  All other systems reviewed and are negative.   Physical Exam Updated Vital Signs BP 122/85 (BP Location: Left Arm)   Pulse (!) 108   Temp 99.1 F (37.3 C) (Oral)   Resp 17   Ht 5\' 4"  (1.626 m)   Wt 86.2 kg   LMP 09/24/2022 (Approximate)   SpO2 93%   BMI 32.62 kg/m  Physical Exam Vitals and nursing note reviewed.  Constitutional:      General: She is not in acute distress.    Appearance: Normal appearance. She is well-developed. She is ill-appearing. She is not toxic-appearing or diaphoretic.  HENT:     Head: Normocephalic and atraumatic.     Right Ear: External ear normal.     Left Ear: External ear normal.     Nose: Nose normal.     Mouth/Throat:     Mouth: Mucous membranes are moist.  Eyes:     General: Scleral icterus present.     Extraocular Movements: Extraocular movements intact.     Conjunctiva/sclera: Conjunctivae normal.  Cardiovascular:     Rate and Rhythm: Regular rhythm. Tachycardia present.  Pulmonary:     Effort: Pulmonary effort is normal. No respiratory distress.  Abdominal:     General: There is distension.     Palpations: Abdomen is soft.     Tenderness: There is no abdominal tenderness. There is no guarding or rebound.  Musculoskeletal:  General: No swelling or deformity. Normal range of motion.     Cervical back: Normal range of motion.     Right lower leg: Edema present.     Left lower leg: Edema present.  Skin:    General: Skin is warm and dry.     Coloration: Skin is jaundiced.  Neurological:     General: No focal deficit present.     Mental Status: She is alert and oriented to person, place, and time.  Psychiatric:        Mood and Affect: Mood normal.        Behavior: Behavior normal.     ED Results / Procedures / Treatments   Labs (all labs ordered are listed, but only abnormal results are displayed) Labs Reviewed  LIPASE, BLOOD - Abnormal; Notable for the following components:      Result Value   Lipase 74 (*)     All other components within normal limits  COMPREHENSIVE METABOLIC PANEL - Abnormal; Notable for the following components:   Sodium 128 (*)    Potassium 3.2 (*)    Chloride 87 (*)    BUN 5 (*)    Calcium 8.8 (*)    Total Protein 6.4 (*)    Albumin 3.2 (*)    AST 726 (*)    ALT 169 (*)    Alkaline Phosphatase 373 (*)    Total Bilirubin 28.4 (*)    Anion gap 18 (*)    All other components within normal limits  CBC - Abnormal; Notable for the following components:   RBC 3.53 (*)    MCV 102.3 (*)    MCH 36.3 (*)    RDW 18.5 (*)    All other components within normal limits  URINALYSIS, ROUTINE W REFLEX MICROSCOPIC - Abnormal; Notable for the following components:   Color, Urine ORANGE (*)    APPearance HAZY (*)    Bilirubin Urine LARGE (*)    Protein, ur TRACE (*)    Bacteria, UA RARE (*)    All other components within normal limits  PROTIME-INR - Abnormal; Notable for the following components:   Prothrombin Time 17.1 (*)    INR 1.4 (*)    All other components within normal limits  PREGNANCY, URINE  HEPATITIS PANEL, ACUTE  ACETAMINOPHEN LEVEL  MAGNESIUM  TROPONIN I (HIGH SENSITIVITY)  TROPONIN I (HIGH SENSITIVITY)    EKG EKG Interpretation Date/Time:  Tuesday October 29 2022 19:17:19 EDT Ventricular Rate:  120 PR Interval:  144 QRS Duration:  82 QT Interval:  332 QTC Calculation: 469 R Axis:   63  Text Interpretation: Sinus tachycardia Possible Anterior infarct , age undetermined ST & T wave abnormality, consider lateral ischemia Abnormal ECG Confirmed by Gloris Manchester [phone]) on 10/29/2022 7:20:47 PM  Radiology CT ABDOMEN PELVIS W CONTRAST  Result Date: 10/29/2022 CLINICAL DATA:  Abdominal pain, nausea, and vomiting. Swollen legs and feet, abnormal bowel movements, severe fatigue, and jaundice. EXAM: CT ABDOMEN AND PELVIS WITH CONTRAST TECHNIQUE: Multidetector CT imaging of the abdomen and pelvis was performed using the standard protocol following bolus administration of  intravenous contrast. RADIATION DOSE REDUCTION: This exam was performed according to the departmental dose-optimization program which includes automated exposure control, adjustment of the mA and/or kV according to patient size and/or use of iterative reconstruction technique. CONTRAST:  80mL OMNIPAQUE IOHEXOL 300 MG/ML  SOLN COMPARISON:  01/30/2023. FINDINGS: Lower chest: No acute abnormality. Hepatobiliary: Severe hepatic steatosis is noted. No focal abnormality. The gallbladder is without  stones. There is suggestion of gallbladder wall thickening and mucosal enhancement with fat stranding at the gallbladder fossa. No biliary ductal dilatation. Pancreas: Unremarkable. No pancreatic ductal dilatation or surrounding inflammatory changes. Spleen: Normal in size without focal abnormality. Adrenals/Urinary Tract: The adrenal glands are within normal limits. The kidneys enhance symmetrically. No renal calculus or hydronephrosis. The bladder is unremarkable. Stomach/Bowel: Stomach is within normal limits. Appendix appears normal. No evidence of bowel obstruction. No free air or pneumatosis. Scattered diverticula are present along the colon without evidence of diverticulitis. There is mild diffuse colonic wall thickening. Vascular/Lymphatic: Aortic atherosclerosis. No enlarged abdominal or pelvic lymph nodes. Few perigastric and periesophageal varices are noted. Reproductive: The uterus is within normal limits. There is a 3.3 cm cyst in the right adnexa, likely ovarian in origin. No adnexal mass on the left. Other: Small ascites in all 4 quadrants.  Mild anasarca is noted. Musculoskeletal: No acute osseous abnormality. IMPRESSION: 1. Gallbladder wall thickening with mucosal enhancement. Ultrasound is recommended for further evaluation. 2. Severe hepatic steatosis. 3. Mild diffuse colonic wall thickening, possible colitis. 4. Mild ascites and anasarca. 5. Aortic atherosclerosis. Electronically Signed   By: Thornell Sartorius  M.D.   On: 10/29/2022 21:57   DG Chest 2 View  Result Date: 10/29/2022 CLINICAL DATA:  Shortness of breath. EXAM: CHEST - 2 VIEW COMPARISON:  None Available. FINDINGS: The heart size and mediastinal contours are within normal limits. Both lungs are clear. The visualized skeletal structures are unremarkable. IMPRESSION: No active cardiopulmonary disease. Electronically Signed   By: Elgie Collard M.D.   On: 10/29/2022 20:12    Procedures Procedures    Medications Ordered in ED Medications  LORazepam (ATIVAN) tablet 1-4 mg (has no administration in time range)    Or  LORazepam (ATIVAN) injection 1-4 mg (has no administration in time range)  thiamine (VITAMIN B1) tablet 100 mg (has no administration in time range)    Or  thiamine (VITAMIN B1) injection 100 mg (has no administration in time range)  folic acid (FOLVITE) tablet 1 mg (has no administration in time range)  multivitamin with minerals tablet 1 tablet (has no administration in time range)  lactated ringers bolus 500 mL (has no administration in time range)  potassium chloride SA (KLOR-CON M) CR tablet 40 mEq (has no administration in time range)  iohexol (OMNIPAQUE) 300 MG/ML solution 100 mL (80 mLs Intravenous Contrast Given 10/29/22 2125)  lactated ringers bolus 500 mL (500 mLs Intravenous New Bag/Given 10/29/22 2155)  LORazepam (ATIVAN) injection 1 mg (1 mg Intravenous Given 10/29/22 2154)    ED Course/ Medical Decision Making/ A&P                                 Medical Decision Making Amount and/or Complexity of Data Reviewed Labs: ordered. Radiology: ordered.  Risk OTC drugs. Prescription drug management.   This patient presents to the ED for concern of multiple complaints, this involves an extensive number of treatment options, and is a complaint that carries with it a high risk of complications and morbidity.  The differential diagnosis includes liver failure, intra-abdominal infection, SBO, biliary obstruction,  alcoholic hepatitis, other hepatitis   Co morbidities that complicate the patient evaluation  Alcohol abuse   Additional history obtained:  Additional history obtained from patient's mother External records from outside source obtained and reviewed including EMR   Lab Tests:  I Ordered, and personally interpreted labs.  The pertinent  results include: Elevated transaminases consistent with alcoholic hepatitis, severely elevated bilirubin, hyponatremia and hypokalemia no leukocytosis.   Imaging Studies ordered:  I ordered imaging studies including chest x-ray, CT of abdomen and pelvis I independently visualized and interpreted imaging which showed mild ascites, colonic wall thickening, gallbladder wall thickening, all likely secondary to anasarca I agree with the radiologist interpretation   Cardiac Monitoring: / EKG:  The patient was maintained on a cardiac monitor.  I personally viewed and interpreted the cardiac monitored which showed an underlying rhythm of: Sinus rhythm   Consultations Obtained:  I requested consultation with the gastroenterologist, Dr. Bosie Clos,  and discussed lab and imaging findings as well as pertinent plan - they recommend: Will follow in consult   Problem List / ED Course / Critical interventions / Medication management  Patient presents for multiple complaints.  Among these include generalized abdominal discomfort, frequent vomiting, mental and extremity swelling.  Vital signs on arrival notable for tachycardia.  Patient is severely jaundiced on exam.  Her abdomen is soft and she endorses only mild tenderness.  She does endorse heavy alcohol use, approximately 750 mL of liquor per day.  Last drink was earlier today.  Other than tachycardia, patient does not show any acute signs of withdrawal.  Workup was initiated and findings are concerning for alcoholic hepatitis with possible liver failure.  CT imaging showed hepatic steatosis.  There were areas of  colonic wall thickening, gallbladder wall thickening, and ascites, which is likely secondary to anasarca.  Patient's poor oncotic pressure likely driving her anasarca.  Current mildly Paya score is 26.  Patient does wish to stop drinking alcohol.  CIWA protocol was initiated.  I spoke with gastroenterologist, Dr. Bosie Clos, who will see the patient in consult.  He request admission to [facility].  Patient was admitted for further management. I ordered medication including IV fluids for hydration; Ativan for alcohol withdrawal; potassium chloride for hypokalemia Reevaluation of the patient after these medicines showed that the patient improved I have reviewed the patients home medicines and have made adjustments as needed   Social Determinants of Health:  Current alcohol abuse, does want to quit.         Final Clinical Impression(s) / ED Diagnoses Final diagnoses:  Alcoholic hepatitis with ascites    Rx / DC Orders ED Discharge Orders     None         Gloris Manchester, MD 10/29/22 2314

## 2022-10-29 NOTE — ED Notes (Signed)
Date and time results received: 10/29/22 2032 (use smartphrase ".now" to insert current time)  Test: Bilirubin Critical Value: 28.4  Name of Provider Notified: MD Durwin Nora  Orders Received? Or Actions Taken?: Orders Received - See Orders for details

## 2022-10-30 ENCOUNTER — Inpatient Hospital Stay (HOSPITAL_COMMUNITY): Payer: Medicaid Other

## 2022-10-30 ENCOUNTER — Encounter (HOSPITAL_COMMUNITY): Payer: Self-pay | Admitting: Internal Medicine

## 2022-10-30 DIAGNOSIS — F1090 Alcohol use, unspecified, uncomplicated: Secondary | ICD-10-CM | POA: Diagnosis not present

## 2022-10-30 DIAGNOSIS — Z79899 Other long term (current) drug therapy: Secondary | ICD-10-CM | POA: Diagnosis not present

## 2022-10-30 DIAGNOSIS — K7010 Alcoholic hepatitis without ascites: Secondary | ICD-10-CM | POA: Diagnosis present

## 2022-10-30 DIAGNOSIS — K529 Noninfective gastroenteritis and colitis, unspecified: Secondary | ICD-10-CM | POA: Diagnosis present

## 2022-10-30 DIAGNOSIS — F17210 Nicotine dependence, cigarettes, uncomplicated: Secondary | ICD-10-CM | POA: Diagnosis present

## 2022-10-30 DIAGNOSIS — R768 Other specified abnormal immunological findings in serum: Secondary | ICD-10-CM | POA: Diagnosis present

## 2022-10-30 DIAGNOSIS — R000 Tachycardia, unspecified: Secondary | ICD-10-CM | POA: Diagnosis not present

## 2022-10-30 DIAGNOSIS — R17 Unspecified jaundice: Secondary | ICD-10-CM

## 2022-10-30 DIAGNOSIS — K760 Fatty (change of) liver, not elsewhere classified: Secondary | ICD-10-CM | POA: Diagnosis present

## 2022-10-30 DIAGNOSIS — K7031 Alcoholic cirrhosis of liver with ascites: Secondary | ICD-10-CM | POA: Diagnosis not present

## 2022-10-30 DIAGNOSIS — D649 Anemia, unspecified: Secondary | ICD-10-CM | POA: Diagnosis present

## 2022-10-30 DIAGNOSIS — E538 Deficiency of other specified B group vitamins: Secondary | ICD-10-CM | POA: Diagnosis present

## 2022-10-30 DIAGNOSIS — K7011 Alcoholic hepatitis with ascites: Secondary | ICD-10-CM | POA: Diagnosis not present

## 2022-10-30 DIAGNOSIS — E876 Hypokalemia: Secondary | ICD-10-CM | POA: Diagnosis not present

## 2022-10-30 DIAGNOSIS — K759 Inflammatory liver disease, unspecified: Secondary | ICD-10-CM | POA: Diagnosis not present

## 2022-10-30 DIAGNOSIS — K7210 Chronic hepatic failure without coma: Secondary | ICD-10-CM | POA: Diagnosis present

## 2022-10-30 DIAGNOSIS — E669 Obesity, unspecified: Secondary | ICD-10-CM | POA: Diagnosis not present

## 2022-10-30 DIAGNOSIS — F1020 Alcohol dependence, uncomplicated: Secondary | ICD-10-CM | POA: Diagnosis present

## 2022-10-30 DIAGNOSIS — E871 Hypo-osmolality and hyponatremia: Secondary | ICD-10-CM

## 2022-10-30 DIAGNOSIS — D539 Nutritional anemia, unspecified: Secondary | ICD-10-CM | POA: Diagnosis present

## 2022-10-30 DIAGNOSIS — Y906 Blood alcohol level of 120-199 mg/100 ml: Secondary | ICD-10-CM | POA: Diagnosis present

## 2022-10-30 DIAGNOSIS — Z6832 Body mass index (BMI) 32.0-32.9, adult: Secondary | ICD-10-CM | POA: Diagnosis not present

## 2022-10-30 DIAGNOSIS — D689 Coagulation defect, unspecified: Secondary | ICD-10-CM | POA: Diagnosis present

## 2022-10-30 DIAGNOSIS — F1029 Alcohol dependence with unspecified alcohol-induced disorder: Secondary | ICD-10-CM | POA: Diagnosis not present

## 2022-10-30 HISTORY — PX: IR PARACENTESIS: IMG2679

## 2022-10-30 LAB — ETHANOL: Alcohol, Ethyl (B): 187 mg/dL — ABNORMAL HIGH (ref <10)

## 2022-10-30 LAB — LACTATE DEHYDROGENASE, PLEURAL OR PERITONEAL FLUID: LD, Fluid: 114 U/L — ABNORMAL HIGH (ref 3–23)

## 2022-10-30 LAB — FERRITIN: Ferritin: 2498 ng/mL — ABNORMAL HIGH (ref 11–307)

## 2022-10-30 LAB — IRON AND TIBC
Iron: 160 ug/dL (ref 28–170)
Saturation Ratios: 103 % — ABNORMAL HIGH (ref 10.4–31.8)
TIBC: 155 ug/dL — ABNORMAL LOW (ref 250–450)

## 2022-10-30 LAB — HIV ANTIBODY (ROUTINE TESTING W REFLEX): HIV Screen 4th Generation wRfx: NONREACTIVE

## 2022-10-30 LAB — GRAM STAIN

## 2022-10-30 LAB — BODY FLUID CELL COUNT WITH DIFFERENTIAL
Eos, Fluid: 0 %
Lymphs, Fluid: 58 %
Monocyte-Macrophage-Serous Fluid: 40 % — ABNORMAL LOW (ref 50–90)
Neutrophil Count, Fluid: 2 % (ref 0–25)
Total Nucleated Cell Count, Fluid: 47 cu mm (ref 0–1000)

## 2022-10-30 LAB — ALBUMIN, PLEURAL OR PERITONEAL FLUID: Albumin, Fluid: <1.5 g/dL

## 2022-10-30 MED ORDER — DOCUSATE SODIUM 100 MG PO CAPS
100.0000 mg | ORAL_CAPSULE | Freq: Two times a day (BID) | ORAL | Status: DC
  Administered 2022-10-30 – 2022-11-01 (×5): 100 mg via ORAL
  Filled 2022-10-30 (×8): qty 1

## 2022-10-30 MED ORDER — LIDOCAINE HCL 1 % IJ SOLN
INTRAMUSCULAR | Status: AC
  Filled 2022-10-30: qty 20

## 2022-10-30 MED ORDER — PREDNISOLONE 5 MG PO TABS
40.0000 mg | ORAL_TABLET | Freq: Every day | ORAL | Status: DC
  Administered 2022-10-30 – 2022-11-07 (×9): 40 mg via ORAL
  Filled 2022-10-30 (×9): qty 8

## 2022-10-30 MED ORDER — ADULT MULTIVITAMIN W/MINERALS CH
1.0000 | ORAL_TABLET | Freq: Every day | ORAL | Status: DC
  Administered 2022-10-30 – 2022-11-07 (×9): 1 via ORAL
  Filled 2022-10-30 (×9): qty 1

## 2022-10-30 MED ORDER — BISACODYL 5 MG PO TBEC: 5.0000 mg | DELAYED_RELEASE_TABLET | Freq: Every day | ORAL | Status: DC | PRN

## 2022-10-30 MED ORDER — LORAZEPAM 1 MG PO TABS
1.0000 mg | ORAL_TABLET | ORAL | Status: DC | PRN
  Administered 2022-10-30: 1 mg via ORAL
  Administered 2022-10-30 – 2022-10-31 (×9): 2 mg via ORAL
  Administered 2022-10-31 – 2022-11-02 (×5): 1 mg via ORAL
  Filled 2022-10-30 (×2): qty 2
  Filled 2022-10-30: qty 1
  Filled 2022-10-30 (×3): qty 2
  Filled 2022-10-30 (×2): qty 1
  Filled 2022-10-30 (×3): qty 2
  Filled 2022-10-30 (×3): qty 1
  Filled 2022-10-30: qty 2

## 2022-10-30 MED ORDER — NICOTINE 21 MG/24HR TD PT24
21.0000 mg | MEDICATED_PATCH | Freq: Every day | TRANSDERMAL | Status: DC
  Administered 2022-10-30 – 2022-11-06 (×8): 21 mg via TRANSDERMAL
  Filled 2022-10-30 (×8): qty 1

## 2022-10-30 MED ORDER — HYDRALAZINE HCL 20 MG/ML IJ SOLN: 5.0000 mg | INTRAMUSCULAR | Status: DC | PRN

## 2022-10-30 MED ORDER — OXYCODONE HCL 5 MG PO TABS
5.0000 mg | ORAL_TABLET | ORAL | Status: DC | PRN
  Administered 2022-10-30 – 2022-11-06 (×5): 5 mg via ORAL
  Filled 2022-10-30 (×5): qty 1

## 2022-10-30 MED ORDER — ONDANSETRON HCL 4 MG PO TABS: 4.0000 mg | ORAL_TABLET | Freq: Four times a day (QID) | ORAL | Status: DC | PRN

## 2022-10-30 MED ORDER — ONDANSETRON HCL 4 MG/2ML IJ SOLN: 4.0000 mg | Freq: Four times a day (QID) | INTRAMUSCULAR | Status: DC | PRN

## 2022-10-30 MED ORDER — POLYETHYLENE GLYCOL 3350 17 G PO PACK: 17.0000 g | PACK | Freq: Every day | ORAL | Status: DC | PRN

## 2022-10-30 MED ORDER — LORAZEPAM 2 MG/ML IJ SOLN: 1.0000 mg | INTRAMUSCULAR | Status: DC | PRN

## 2022-10-30 NOTE — Plan of Care (Signed)
  Problem: Nutrition: Goal: Adequate nutrition will be maintained Outcome: Progressing   Problem: Education: Goal: Knowledge of General Education information will improve Description: Including pain rating scale, medication(s)/side effects and non-pharmacologic comfort measures Outcome: Progressing   Problem: Health Behavior/Discharge Planning: Goal: Ability to manage health-related needs will improve Outcome: Progressing   Problem: Clinical Measurements: Goal: Ability to maintain clinical measurements within normal limits will improve Outcome: Progressing Goal: Will remain free from infection Outcome: Progressing Goal: Diagnostic test results will improve Outcome: Progressing Goal: Respiratory complications will improve Outcome: Progressing Goal: Cardiovascular complication will be avoided Outcome: Progressing   Problem: Activity: Goal: Risk for activity intolerance will decrease Outcome: Progressing   Problem: Nutrition: Goal: Adequate nutrition will be maintained Outcome: Progressing   Problem: Coping: Goal: Level of anxiety will decrease Outcome: Progressing   Problem: Elimination: Goal: Will not experience complications related to bowel motility Outcome: Progressing Goal: Will not experience complications related to urinary retention Outcome: Progressing   Problem: Pain Managment: Goal: General experience of comfort will improve Outcome: Progressing   Problem: Safety: Goal: Ability to remain free from injury will improve Outcome: Progressing   Problem: Skin Integrity: Goal: Risk for impaired skin integrity will decrease Outcome: Progressing

## 2022-10-30 NOTE — Consult Note (Signed)
Referring Provider: [facility] Primary Care Physician:  Patient, No Pcp Per Primary Gastroenterologist:  Unassigned  Reason for Consultation:  Jaundice, hepatitis  HPI: Tanya Goodman is a 37 y.o. female with h/o ETOH use d/o presenting with abdominal pain, increasing abdominal girth, and jaundice.  She has noticed a lot of abdominal pressure that developed over the past month and LE edema.  She noticed jaundice in the last week or two, says that her urine has been dark in color.  She drinks daily, mostly vodka 1/5 a day or a couple of bottles of wine.  She went through detox a few months ago and did well for almost a month, went through a break up and moving and that triggered her.   Started drinking again about 2 months ago.  She has had "severe stomach problems" with n/v/d last year but it went away.  She says that she had some diarrhea maybe a month or so ago, but now for the past few weeks it has been more constipation.  She has occasional bright red blood on the toilet paper upon wiping.  Describes seeing food product in her stool such as pink watermelon.  Describes diffuse abdominal pain with pressure along the sides and going into her back.  Labs showed metabolic derangements with a sodium of 120 initially, potassium 2.9 today.  LFTs significantly elevated the total bili in the 20s, alk phos 300s, AST up to 726, ALT up to 169.  Alcohol level was 187 today.  INR 1.4.  White blood cell count, hemoglobin, platelets normal.  Viral hepatitis panel negative.  Paracentesis with 200 cc of fluid removed today, studies pending.  CT scan of the abdomen and pelvis with contrast showed the following:  IMPRESSION: 1. Gallbladder wall thickening with mucosal enhancement. Ultrasound is recommended for further evaluation. 2. Severe hepatic steatosis. 3. Mild diffuse colonic wall thickening, possible colitis. 4. Mild ascites and anasarca. 5. Aortic atherosclerosis.   Past Medical History:  Diagnosis Date    Alcohol use disorder    SVD (spontaneous vaginal delivery)    x 3    Past Surgical History:  Procedure Laterality Date   NO PAST SURGERIES      Prior to Admission medications   Medication Sig Start Date End Date Taking? Authorizing Provider  gabapentin (NEURONTIN) 300 MG capsule Take 1 capsule (300 mg total) by mouth at bedtime. Patient not taking: Reported on 10/30/2022 07/13/22   Bobbye Morton, MD  hydrOXYzine (ATARAX) 25 MG tablet Take 1 tablet (25 mg total) by mouth 3 (three) times daily as needed for anxiety. Patient not taking: Reported on 10/30/2022 07/13/22   Bobbye Morton, MD  melatonin 5 MG TABS Take 1 tablet (5 mg total) by mouth at bedtime. Patient not taking: Reported on 10/30/2022 07/13/22   Bobbye Morton, MD  nicotine (NICODERM CQ - DOSED IN MG/24 HOURS) 14 mg/24hr patch Place 1 patch (14 mg total) onto the skin daily as needed (smoking cessation). Patient not taking: Reported on 10/30/2022 07/13/22   Bobbye Morton, MD  thiamine (VITAMIN B-1) 100 MG tablet Take 1 tablet (100 mg total) by mouth daily. Patient not taking: Reported on 10/30/2022 07/14/22   Bobbye Morton, MD    Current Facility-Administered Medications  Medication Dose Route Frequency Provider Last Rate Last Admin   bisacodyl (DULCOLAX) EC tablet 5 mg  5 mg Oral Daily PRN Jonah Blue, MD       docusate sodium (COLACE) capsule 100 mg  100 mg Oral BID Jonah Blue, MD   100 mg at 10/30/22 1042   folic acid (FOLVITE) tablet 1 mg  1 mg Oral Daily Jonah Blue, MD   1 mg at 10/30/22 0915   hydrALAZINE (APRESOLINE) injection 5 mg  5 mg Intravenous Q4H PRN Jonah Blue, MD       LORazepam (ATIVAN) tablet 1-4 mg  1-4 mg Oral Q1H PRN Jonah Blue, MD   1 mg at 10/30/22 1009   Or   LORazepam (ATIVAN) injection 1-4 mg  1-4 mg Intravenous Q1H PRN Jonah Blue, MD       multivitamin with minerals tablet 1 tablet  1 tablet Oral Daily Jonah Blue, MD   1 tablet at 10/30/22 1035   nicotine  (NICODERM CQ - dosed in mg/24 hours) patch 21 mg  21 mg Transdermal Once Gloris Manchester, MD   21 mg at 10/30/22 0129   nicotine (NICODERM CQ - dosed in mg/24 hours) patch 21 mg  21 mg Transdermal QHS Jonah Blue, MD       ondansetron [facility]) tablet 4 mg  4 mg Oral Q6H PRN Jonah Blue, MD       Or   ondansetron [facility]) injection 4 mg  4 mg Intravenous Q6H PRN Jonah Blue, MD       oxyCODONE (Oxy IR/ROXICODONE) immediate release tablet 5 mg  5 mg Oral Q4H PRN Jonah Blue, MD       polyethylene glycol (MIRALAX / GLYCOLAX) packet 17 g  17 g Oral Daily PRN Jonah Blue, MD       thiamine (VITAMIN B1) tablet 100 mg  100 mg Oral Daily Jonah Blue, MD   100 mg at 10/30/22 0915    Allergies as of 10/29/2022   (No Known Allergies)    Family History  Problem Relation Age of Onset   Liver disease Neg Hx    Alcohol abuse Neg Hx     Social History   Socioeconomic History   Marital status: Single    Spouse name: Not on file   Number of children: Not on file   Years of education: Not on file   Highest education level: Not on file  Occupational History   Occupation: bartender  Tobacco Use   Smoking status: Every Day    Current packs/day: 1.00    Average packs/day: 1 pack/day for 17.0 years (17.0 ttl pk-yrs)    Types: Cigarettes    Start date: 10/29/2005   Smokeless tobacco: Never  Substance and Sexual Activity   Alcohol use: Yes    Comment: pt states "probably too much"   Drug use: Yes    Types: Marijuana, Benzodiazepines    Comment: rare   Sexual activity: Yes    Birth control/protection: Pill  Other Topics Concern   Not on file  Social History Narrative   Not on file   Social Determinants of Health   Financial Resource Strain: Not on file  Food Insecurity: Food Insecurity Present (07/09/2022)   Hunger Vital Sign    Worried About Running Out of Food in the Last Year: Sometimes true    Ran Out of Food in the Last Year: Sometimes true  Transportation Needs:  No Transportation Needs (10/30/2022)   PRAPARE - Administrator, Civil Service (Medical): No    Lack of Transportation (Non-Medical): No  Physical Activity: Not on file  Stress: Not on file  Social Connections: Not on file  Intimate Partner Violence: Not At Risk (10/30/2022)  Humiliation, Afraid, Rape, and Kick questionnaire    Fear of Current or Ex-Partner: No    Emotionally Abused: No    Physically Abused: No    Sexually Abused: No    Review of Systems: ROS is O/W negative except as mentioned in HPI.  Physical Exam: Vital signs in last 24 hours: Temp:  [98.1 F (36.7 C)-99.3 F (37.4 C)] 98.6 F (37 C) (07/31 0919) Pulse Rate:  [108-136] 136 (07/31 1223) Resp:  [13-29] 22 (07/31 1223) BP: (106-130)/(71-113) 127/113 (07/31 1223) SpO2:  [93 %-96 %] 96 % (07/31 1223) Weight:  [86.2 kg] 86.2 kg (07/30 1911) Last BM Date : 10/30/22 General:  Alert, ill-appearing, pleasant and cooperative in NAD Head:  Normocephalic and atraumatic. Eyes:  Sclera clear, no icterus.  Conjunctiva pink. Ears:  Normal auditory acuity. Mouth:  No deformity or lesions.   Lungs:  Clear throughout to auscultation.  No wheezes, crackles, or rhonchi.  Heart:  Tachy. Abdomen:  Soft, somewhat distended.  BS present.  Diffuse TTP. Msk:  Symmetrical without gross deformities. Pulses:  Normal pulses noted. Extremities: Some pitting edema bilateral lower extremities even up to her thighs. Neurologic:  Alert and oriented x 4;  grossly normal neurologically. Skin:  Intact without significant lesions or rashes. Psych:  Alert and cooperative. Normal mood and affect.  Intake/Output from previous day: 07/30 0701 - 07/31 0700 In: 500 [IV Piggyback:500] Out: -  Intake/Output this shift: Total I/O In: 240 [P.O.:240] Out: -   Lab Results: Recent Labs    10/29/22 1931 10/30/22 0623  WBC 5.9 4.9  HGB 12.8 9.9*  HCT 36.1 28.4*  PLT 205 158   BMET Recent Labs    10/29/22 1931 10/30/22 0623   NA 128* 130*  K 3.2* 2.9*  CL 87* 93*  CO2 23 21*  GLUCOSE 93 77  BUN 5* <5*  CREATININE 0.54 0.51  CALCIUM 8.8* 7.9*   LFT Recent Labs    10/30/22 0623  PROT 5.3*  ALBUMIN 1.9*  AST 593*  ALT 129*  ALKPHOS 305*  BILITOT 21.5*   PT/INR Recent Labs    10/29/22 2116  LABPROT 17.1*  INR 1.4*   Hepatitis Panel Recent Labs    10/29/22 2116  HEPBSAG NON REACTIVE  HCVAB NON REACTIVE  HEPAIGM NON REACTIVE  HEPBIGM NON REACTIVE    Studies/Results: CT ABDOMEN PELVIS W CONTRAST  Result Date: 10/29/2022 CLINICAL DATA:  Abdominal pain, nausea, and vomiting. Swollen legs and feet, abnormal bowel movements, severe fatigue, and jaundice. EXAM: CT ABDOMEN AND PELVIS WITH CONTRAST TECHNIQUE: Multidetector CT imaging of the abdomen and pelvis was performed using the standard protocol following bolus administration of intravenous contrast. RADIATION DOSE REDUCTION: This exam was performed according to the departmental dose-optimization program which includes automated exposure control, adjustment of the mA and/or kV according to patient size and/or use of iterative reconstruction technique. CONTRAST:  80mL OMNIPAQUE IOHEXOL 300 MG/ML  SOLN COMPARISON:  01/30/2023. FINDINGS: Lower chest: No acute abnormality. Hepatobiliary: Severe hepatic steatosis is noted. No focal abnormality. The gallbladder is without stones. There is suggestion of gallbladder wall thickening and mucosal enhancement with fat stranding at the gallbladder fossa. No biliary ductal dilatation. Pancreas: Unremarkable. No pancreatic ductal dilatation or surrounding inflammatory changes. Spleen: Normal in size without focal abnormality. Adrenals/Urinary Tract: The adrenal glands are within normal limits. The kidneys enhance symmetrically. No renal calculus or hydronephrosis. The bladder is unremarkable. Stomach/Bowel: Stomach is within normal limits. Appendix appears normal. No evidence of bowel obstruction. No free  air or  pneumatosis. Scattered diverticula are present along the colon without evidence of diverticulitis. There is mild diffuse colonic wall thickening. Vascular/Lymphatic: Aortic atherosclerosis. No enlarged abdominal or pelvic lymph nodes. Few perigastric and periesophageal varices are noted. Reproductive: The uterus is within normal limits. There is a 3.3 cm cyst in the right adnexa, likely ovarian in origin. No adnexal mass on the left. Other: Small ascites in all 4 quadrants.  Mild anasarca is noted. Musculoskeletal: No acute osseous abnormality. IMPRESSION: 1. Gallbladder wall thickening with mucosal enhancement. Ultrasound is recommended for further evaluation. 2. Severe hepatic steatosis. 3. Mild diffuse colonic wall thickening, possible colitis. 4. Mild ascites and anasarca. 5. Aortic atherosclerosis. Electronically Signed   By: Thornell Sartorius M.D.   On: 10/29/2022 21:57   DG Chest 2 View  Result Date: 10/29/2022 CLINICAL DATA:  Shortness of breath. EXAM: CHEST - 2 VIEW COMPARISON:  None Available. FINDINGS: The heart size and mediastinal contours are within normal limits. Both lungs are clear. The visualized skeletal structures are unremarkable. IMPRESSION: No active cardiopulmonary disease. Electronically Signed   By: Elgie Collard M.D.   On: 10/29/2022 20:12    IMPRESSION:  *Likely acute alcohol hepatitis with LFTs significantly elevated, AST greater than 3 times ALT level.  Alcohol level on admission was 187.  Acute hepatitis studies negative.  Discriminant function of 52. *CT scan showing mild ascites and anasarca.  Paracentesis with 200 cc of fluid removed, *Mild diffuse colonic wall thickening and gallbladder wall thickening likely due to anasarca/ascites and low albumin levels. *Hypokalemia with potassium of 2.9 *Hyponatremia with sodium of 130 *Mild coagulopathy with an INR of 1.4. *Elevated ammonia at 68, but seems to be mentating normally.  PLAN: -Await fluid studies.  I have added on  cytology.  If negative for SBP may benefit from steroids/prednisolone.   -Correct sodium and potassium, trend labs. -Monitor for DTs/CIWA. -Will check iron studies and serologies for autoimmune liver disease.  Princella Pellegrini.   10/30/2022, 12:25 PM

## 2022-10-30 NOTE — Progress Notes (Signed)
Initial Nutrition Assessment  DOCUMENTATION CODES:   Not applicable  INTERVENTION:  Education on adequate nutrition   MVI    NUTRITION DIAGNOSIS:   Altered GI function related to vomiting, nausea, constipation, diarrhea as evidenced by per patient/family report.  GOAL:   Patient will meet greater than or equal to 90% of their needs   MONITOR:   PO intake, Skin, I & O's, Labs, Weight trends  REASON FOR ASSESSMENT:   Consult Assessment of nutrition requirement/status  ASSESSMENT:   37 y.o. female with hx of EtOH abuse presents with abdominal pain, increasing abdominal girth and jaundice. Patient admitted with alcoholic hepatitis.  Visited patient at bedside who was eating lunch-- pot roast, mashed potatoes and carrots. She had eaten most of it before RD arrived. She reports her appetite is improving each day and that it was poor PTA due to GI distress 2/2 alcohol consumption. She reports eating 4-6 small meals per day to better manage nausea. Patient reports she was constipated and would have frequent fecal urgency and then only pass a little bit of hard stool and mucus. Patient reports feeling weak and tired today. She reports not sleeping well last night.   Per MD note: Drinks 1/4 of vodka per day or couple bottles of wine. She was in recovery for a month until major life events occurred causing her to relapse.   Patient has had N/V/D, bloody stool, and reports of seeing undigested food products in her stool.   S/p paracentesis- 200 ml of fluid removed 7/31   Labs: Na 125, Glu 134, Cl 90, BUN <5, alk phos 288, lipase 74, AST 555, ALT 134, ammonia 68 Meds: colace, folvite, MVI, thiamine. Protonix, prednisolone, nicoderm  Wt: limited wt hx  10/29/22 86.2 kg  09/16/17 86.2 kg   PO: no meals documented at this time  I/O's:  +740 ml since admission   NUTRITION - FOCUSED PHYSICAL EXAM:  Flowsheet Row Most Recent Value  Orbital Region No depletion  Upper Arm Region  Mild depletion  Thoracic and Lumbar Region No depletion  Buccal Region No depletion  Temple Region No depletion  Clavicle Bone Region No depletion  Clavicle and Acromion Bone Region No depletion  Scapular Bone Region Unable to assess  Dorsal Hand No depletion  Edema (RD Assessment) None  Hair Reviewed  Eyes Reviewed  Mouth Reviewed  Skin Other (Comment)  [jaundice]  Nails Reviewed       Diet Order:   Diet Order             Diet regular Room service appropriate? Yes; Fluid consistency: Thin  Diet effective now                   EDUCATION NEEDS:   Education needs have been addressed  Skin:  Skin Assessment: Reviewed RN Assessment  Last BM:  7/31  Height:   Ht Readings from Last 1 Encounters:  10/29/22 5\' 4"  (1.626 m)    Weight:   Wt Readings from Last 1 Encounters:  10/29/22 86.2 kg    Ideal Body Weight:     BMI:  Body mass index is 32.62 kg/m.  Estimated Nutritional Needs:   Kcal:  1700-1900  Protein:  90-105 g  Fluid:  >/= 1.9 L    Leodis Rains, RDN, LDN  Clinical Nutrition

## 2022-10-30 NOTE — Plan of Care (Signed)
  Problem: Nutrition: Goal: Adequate nutrition will be maintained Outcome: Progressing   

## 2022-10-30 NOTE — TOC CAGE-AID Note (Signed)
Transition of Care [facility]) - CAGE-AID Screening   Patient Details  Name: Tanya Goodman MRN: [id number] Date of Birth: 07-09-1985  [facility]) CM/SW Contact:    Tom-Johnson, Hershal Coria, RN Phone Number: 10/30/2022, 11:27 AM   Clinical Narrative:  CM spoke with patient at bedside about Alcohol cessation and Educational materials on AVS.       CAGE-AID Screening:    Have You Ever Felt You Ought to Cut Down on Your Drinking or Drug Use?: Yes Have People Annoyed You By Critizing Your Drinking Or Drug Use?: Yes Have You Felt Bad Or Guilty About Your Drinking Or Drug Use?: Yes Have You Ever Had a Drink or Used Drugs First Thing In The Morning to Steady Your Nerves or to Get Rid of a Hangover?: Yes CAGE-AID Score: 4  Substance Abuse Education Offered: Yes  Substance abuse interventions: Transport planner

## 2022-10-30 NOTE — Procedures (Signed)
PROCEDURE SUMMARY:  Successful ultrasound guided paracentesis from the right lower quadrant.  Yielded 200 ml of bright yellow fluid.  No immediate complications.  The patient tolerated the procedure well.   Specimen sent for labs.  EBL < 2 mL  If the patient eventually requires >/=2 paracenteses in a 30 day period, screening evaluation by the [facility] will be assessed.  Alwyn Ren, [place] [phone] 10/30/2022, 1:20 PM

## 2022-10-30 NOTE — H&P (Signed)
History and Physical    Patient: Tanya Goodman WNU:[id number] DOB: [birth year] DOA: 10/29/2022 DOS: the patient was seen and examined on 10/30/2022 PCP: Patient, No Pcp Per  Patient coming from: Home - lives alone; NOK:  Syliva Overman, mother, [phone]   Chief Complaint: Abdominal pain, jaundice  HPI: Tanya Goodman is a 37 y.o. female with h/o ETOH use d/o presenting with abdominal pain, jaundice.  She has noticed a lot of abdominal pressure that developed over a month, feels better currently.  Strange BMs.  LE edema.  She has chronic anxiety, unchanged.  No increased itching.  She noticed jaundice in the last week or two.  She drinks daily, mostly vodka 1/5 a day or a couple of bottles of wine.  She went through detox a few months ago and did well for almost a month, went through a break up and moving and that triggered her.    Started drinking again about 2 months ago.  She has had "severe stomach problems" with n/v/d last year but it went away, otherwise nothing similar.    ER Course:  Carryover, per Dr. Joneen Roach:  Chronic alcohol abuse.  Over the last month she has noted increased abdominal girth, nausea, vomiting, lower extremity edema, recent jaundice.  She drinks approximately 1/5 of alcohol daily.  Her last drink was around midday today.    Bili 20.4, AST 726, ALT 169, alk phos of 373, sodium 128.   CT abdomen pelvis with 1. Gallbladder wall thickening with mucosal enhancement. Ultrasound  is recommended for further evaluation.  2. Severe hepatic steatosis.  3. Mild diffuse colonic wall thickening, possible colitis.  4. Mild ascites and anasarca.  5. Aortic atherosclerosis   Hepatitis panel and ultrasound ordered.  CIWA protocol initiated.  IV fluid hydration initiated.      Review of Systems: As mentioned in the history of present illness. All other systems reviewed and are negative. Past Medical History:  Diagnosis Date   Alcohol use disorder    SVD  (spontaneous vaginal delivery)    x 3   Past Surgical History:  Procedure Laterality Date   IR PARACENTESIS  10/30/2022   NO PAST SURGERIES     Social History:  reports that she has been smoking cigarettes. She started smoking about 17 years ago. She has a 17 pack-year smoking history. She has never used smokeless tobacco. She reports current alcohol use. She reports current drug use. Drugs: Marijuana and Benzodiazepines.  No Known Allergies  Family History  Problem Relation Age of Onset   Liver disease Neg Hx    Alcohol abuse Neg Hx     Prior to Admission medications   Medication Sig Start Date End Date Taking? Authorizing Provider  gabapentin (NEURONTIN) 300 MG capsule Take 1 capsule (300 mg total) by mouth at bedtime. 07/13/22   Bobbye Morton, MD  hydrOXYzine (ATARAX) 25 MG tablet Take 1 tablet (25 mg total) by mouth 3 (three) times daily as needed for anxiety. 07/13/22   Bobbye Morton, MD  melatonin 5 MG TABS Take 1 tablet (5 mg total) by mouth at bedtime. 07/13/22   Bobbye Morton, MD  nicotine (NICODERM CQ - DOSED IN MG/24 HOURS) 14 mg/24hr patch Place 1 patch (14 mg total) onto the skin daily as needed (smoking cessation). 07/13/22   Bobbye Morton, MD  thiamine (VITAMIN B-1) 100 MG tablet Take 1 tablet (100 mg total) by mouth daily. 07/14/22   Bobbye Morton, MD  Physical Exam: Vitals:   10/30/22 1324 10/30/22 1331 10/30/22 1547 10/30/22 1701  BP: (!) 129/94 128/89 131/83 129/88  Pulse:  (!) 128 (!) 132 (!) 136  Resp:  18 14 18   Temp:  98.8 F (37.1 C) 98.5 F (36.9 C) 98.5 F (36.9 C)  TempSrc:  Oral Oral   SpO2:  93% 93% 93%  Weight:      Height:       General:  Appears calm and comfortable and is in NAD, quite jaundiced Eyes:  EOMI, normal lids, iris, +scleral icterus ENT:  grossly normal hearing, lips & tongue, mmm Neck:  no LAD, masses or thyromegaly Cardiovascular:  RR with mild tachycardia, no m/r/g. No LE edema.  Respiratory:   CTA bilaterally with no  wheezes/rales/rhonchi. Mildly increased respiratory effort. Abdomen:  protuberant with apparent ascites, quite distended, no frank fluid wave Skin:  no rash or induration seen on limited exam Musculoskeletal:  grossly normal tone BUE/BLE, good ROM, no bony abnormality Psychiatric: blunted/mildly anxious mood and affect, speech fluent and appropriate, AOx3 Neurologic:  CN 2-12 grossly intact, moves all extremities in coordinated fashion    Radiological Exams on Admission: Independently reviewed - see discussion in A/P where applicable  IR Paracentesis  Result Date: 10/30/2022 INDICATION: Alcohol abuse admitted for abdominal swelling and jaundice. Imaging shows small volume ascites. Interventional radiology asked to perform a diagnostic and therapeutic paracentesis. EXAM: ULTRASOUND GUIDED PARACENTESIS MEDICATIONS: 1% lidocaine 15 mL COMPLICATIONS: None immediate. PROCEDURE: Informed written consent was obtained from the patient after a discussion of the risks, benefits and alternatives to treatment. A timeout was performed prior to the initiation of the procedure. Initial ultrasound scanning demonstrates a small amount of ascites within the right lower abdominal quadrant. The right lower abdomen was prepped and draped in the usual sterile fashion. 1% lidocaine was used for local anesthesia. Following this, a 19 gauge, 10-cm, Yueh catheter was introduced. An ultrasound image was saved for documentation purposes. The paracentesis was performed. The catheter was removed and a dressing was applied. The patient tolerated the procedure well without immediate post procedural complication. FINDINGS: A total of approximately 200 mL of bright yellow fluid was removed. Samples were sent to the laboratory as requested by the clinical team. IMPRESSION: Successful ultrasound-guided paracentesis yielding 200 mL of peritoneal fluid. Procedure performed by Alwyn Ren NP and supervised by Dr. Grace Isaac. PLAN: If the  patient eventually requires >/=2 paracenteses in a 30 day period, candidacy for formal evaluation by the [facility] will be assessed. Electronically Signed   By: Simonne Come M.D.   On: 10/30/2022 13:49   CT ABDOMEN PELVIS W CONTRAST  Result Date: 10/29/2022 CLINICAL DATA:  Abdominal pain, nausea, and vomiting. Swollen legs and feet, abnormal bowel movements, severe fatigue, and jaundice. EXAM: CT ABDOMEN AND PELVIS WITH CONTRAST TECHNIQUE: Multidetector CT imaging of the abdomen and pelvis was performed using the standard protocol following bolus administration of intravenous contrast. RADIATION DOSE REDUCTION: This exam was performed according to the departmental dose-optimization program which includes automated exposure control, adjustment of the mA and/or kV according to patient size and/or use of iterative reconstruction technique. CONTRAST:  80mL OMNIPAQUE IOHEXOL 300 MG/ML  SOLN COMPARISON:  01/30/2023. FINDINGS: Lower chest: No acute abnormality. Hepatobiliary: Severe hepatic steatosis is noted. No focal abnormality. The gallbladder is without stones. There is suggestion of gallbladder wall thickening and mucosal enhancement with fat stranding at the gallbladder fossa. No biliary ductal dilatation. Pancreas: Unremarkable. No pancreatic ductal dilatation  or surrounding inflammatory changes. Spleen: Normal in size without focal abnormality. Adrenals/Urinary Tract: The adrenal glands are within normal limits. The kidneys enhance symmetrically. No renal calculus or hydronephrosis. The bladder is unremarkable. Stomach/Bowel: Stomach is within normal limits. Appendix appears normal. No evidence of bowel obstruction. No free air or pneumatosis. Scattered diverticula are present along the colon without evidence of diverticulitis. There is mild diffuse colonic wall thickening. Vascular/Lymphatic: Aortic atherosclerosis. No enlarged abdominal or pelvic lymph  nodes. Few perigastric and periesophageal varices are noted. Reproductive: The uterus is within normal limits. There is a 3.3 cm cyst in the right adnexa, likely ovarian in origin. No adnexal mass on the left. Other: Small ascites in all 4 quadrants.  Mild anasarca is noted. Musculoskeletal: No acute osseous abnormality. IMPRESSION: 1. Gallbladder wall thickening with mucosal enhancement. Ultrasound is recommended for further evaluation. 2. Severe hepatic steatosis. 3. Mild diffuse colonic wall thickening, possible colitis. 4. Mild ascites and anasarca. 5. Aortic atherosclerosis. Electronically Signed   By: Thornell Sartorius M.D.   On: 10/29/2022 21:57   DG Chest 2 View  Result Date: 10/29/2022 CLINICAL DATA:  Shortness of breath. EXAM: CHEST - 2 VIEW COMPARISON:  None Available. FINDINGS: The heart size and mediastinal contours are within normal limits. Both lungs are clear. The visualized skeletal structures are unremarkable. IMPRESSION: No active cardiopulmonary disease. Electronically Signed   By: Elgie Collard M.D.   On: 10/29/2022 20:12    EKG: Independently reviewed.  Sinus tachycardia with rate 120; nonspecific ST changes with no evidence of acute ischemia   Labs on Admission: I have personally reviewed the available labs and imaging studies at the time of the admission.  Pertinent labs:    Na++ 128 K+ 3.2 Anion gap 18 AP 373 Albumin 3.2 Lipase 74 AST 726/ALT 169/Bili 28.4 HS troponin 11, 9 WBC 4.9 Hgb 12.8, 9.9 INR 1.4 APAP <10 Upreg negative UA: trace protein, rare bacteria ETOH 187   Assessment and Plan: Principal Problem:   Alcoholic hepatitis Active Problems:   Alcohol use disorder   Anemia    Alcoholic hepatitis Acknowledged history of heavy ETOH use Marked abdominal distention and obvious jaundice on exam Markedly elevated bili (28.4) with moderate AST/ALT elevation Discriminant factor is very high, poor prognosis, likely to need glucocorticoids GI consult is  pending Paracentesis requested by IR  ETOH dependence Patient with chronic ETOH dependence Number of drinks per day: 1/5 liquor or several bottles of wine ETOH 187 on presentation CIWA protocol Folate, thiamine, and MVI ordered Will provide symptom-triggered BZD (ativan per CIWA protocol) only since the patient is able to communicate; is not showing current signs of delirium; and has no history of severe withdrawal. TOC team consult for possible inpatient treatment  Anemia Hgb dropped from 12.8 on 7/30 to 9.9 Will recheck in AM, possible lab error MCV elevated, c/w ETOH as the cause Recheck CBC in AM    Advance Care Planning:   Code Status: Full Code   Consults: GI; IR; TOC team  DVT Prophylaxis: SCDs  Family Communication: None present; she declined to have me call her mother at the time of admission  Severity of Illness: The appropriate patient status for this patient is INPATIENT. Inpatient status is judged to be reasonable and necessary in order to provide the required intensity of service to ensure the patient's safety. The patient's presenting symptoms, physical exam findings, and initial radiographic and laboratory data in the context of their chronic comorbidities is felt to place them at high  risk for further clinical deterioration. Furthermore, it is not anticipated that the patient will be medically stable for discharge from the hospital within 2 midnights of admission.   * I certify that at the point of admission it is my clinical judgment that the patient will require inpatient hospital care spanning beyond 2 midnights from the point of admission due to high intensity of service, high risk for further deterioration and high frequency of surveillance required.*  Author: Jonah Blue, MD 10/30/2022 6:41 PM  For on call review [email or web address].

## 2022-10-30 NOTE — TOC CM/SW Note (Signed)
Transition of Care [facility]) - Inpatient Brief Assessment   Patient Details  Name: Tanya Goodman MRN: [id number] Date of Birth: [birth year]  Transition [facility] Care [facility]) CM/SW Contact:    Tom-Johnson, Hershal Coria, RN Phone Number: 10/30/2022, 11:15 AM   Clinical Narrative:  Patient presented to the ED  with Abdominal pain and Swelling, N/V, Bilateral Swollen Lower Extremity, Yellow Skin Discoloration and Severe Fatigue. Admitted for Alcoholic Hepatitis.   From home alone, states she will be staying with her mom at discharge. Has one daughter and two supportive brothers.  Not employed and not on disability. Does not have a PCP and does not have Aeronautical engineer.  Patient requested New Patient establishment and hospital f/u with Internal Medicine, info on AVS.    MATCH will be done at discharge for Prescription assistance if needed.   Medical workup continues. CM will continue to follow as patient progresses with care towards discharge.         Transition of Care Asessment: Insurance and Status: Insurance coverage has been reviewed (Does not have Aeronautical engineer.) Patient has primary care physician: No (New patient sceduled at [facility].) Home environment has been reviewed: Yes Prior level of function:: Independent Prior/Current Home Services: No current home services Social Determinants of Health Reivew: SDOH reviewed no interventions necessary Readmission risk has been reviewed: Yes Transition of care needs: transition of care needs identified, TOC will continue to follow

## 2022-10-31 ENCOUNTER — Other Ambulatory Visit (HOSPITAL_COMMUNITY): Payer: Self-pay

## 2022-10-31 DIAGNOSIS — E871 Hypo-osmolality and hyponatremia: Secondary | ICD-10-CM | POA: Diagnosis not present

## 2022-10-31 DIAGNOSIS — D649 Anemia, unspecified: Secondary | ICD-10-CM

## 2022-10-31 DIAGNOSIS — F1090 Alcohol use, unspecified, uncomplicated: Secondary | ICD-10-CM | POA: Diagnosis not present

## 2022-10-31 DIAGNOSIS — K759 Inflammatory liver disease, unspecified: Secondary | ICD-10-CM

## 2022-10-31 DIAGNOSIS — K7011 Alcoholic hepatitis with ascites: Secondary | ICD-10-CM | POA: Diagnosis not present

## 2022-10-31 DIAGNOSIS — E876 Hypokalemia: Secondary | ICD-10-CM | POA: Diagnosis not present

## 2022-10-31 DIAGNOSIS — R17 Unspecified jaundice: Secondary | ICD-10-CM | POA: Diagnosis not present

## 2022-10-31 MED ORDER — PANTOPRAZOLE SODIUM 40 MG PO TBEC
40.0000 mg | DELAYED_RELEASE_TABLET | Freq: Every day | ORAL | Status: DC
  Administered 2022-10-31 – 2022-11-06 (×7): 40 mg via ORAL
  Filled 2022-10-31 (×7): qty 1

## 2022-10-31 MED ORDER — PHYTONADIONE 5 MG PO TABS
10.0000 mg | ORAL_TABLET | Freq: Every day | ORAL | Status: AC
  Administered 2022-10-31 – 2022-11-02 (×3): 10 mg via ORAL
  Filled 2022-10-31 (×3): qty 2

## 2022-10-31 MED ORDER — SPIRONOLACTONE 25 MG PO TABS
50.0000 mg | ORAL_TABLET | Freq: Every day | ORAL | Status: DC
  Administered 2022-10-31 – 2022-11-01 (×2): 50 mg via ORAL
  Filled 2022-10-31 (×2): qty 2

## 2022-10-31 MED ORDER — FUROSEMIDE 20 MG PO TABS
20.0000 mg | ORAL_TABLET | Freq: Every day | ORAL | Status: DC
  Administered 2022-10-31 – 2022-11-01 (×2): 20 mg via ORAL
  Filled 2022-10-31 (×2): qty 1

## 2022-10-31 NOTE — Progress Notes (Signed)
[facility] Progress Note  CC:  Jaundice, hepatitis   Subjective:  Abdominal pain is much better.  Is eating [place] food with her mom for her birthday. I walked in the hallway with her to get a soda to have with her lunch.    Objective:  Vital signs in last 24 hours: Temp:  [98 F (36.7 C)-98.5 F (36.9 C)] 98.2 F (36.8 C) (08/01 0832) Pulse Rate:  [86-136] 104 (08/01 0832) Resp:  [14-18] 18 (08/01 0421) BP: (108-131)/(68-97) 116/88 (08/01 0832) SpO2:  [92 %-97 %] 94 % (08/01 0832) Last BM Date : 10/30/22 General:  Alert, deep jaundice noted, in NAD Heart:  Slightly tachy but rhythm; no murmurs Pulm:  CTAB.  No W/R/R. Abdomen:  Soft, somewhat distended.  BS present.  Minimal diffuse TTP. Extremities:  Pitting edema noted in B/L LEs Neurologic:  Alert and oriented x 4;  grossly normal neurologically. Psych:  Alert and cooperative. Normal mood and affect.  Intake/Output from previous day: 07/31 0701 - 08/01 0700 In: 240 [P.O.:240] Out: 0  Intake/Output this shift: Total I/O In: 840 [P.O.:840] Out: 0   Lab Results: Recent Labs    10/29/22 1931 10/30/22 0623 10/31/22 0116  WBC 5.9 4.9 4.3  HGB 12.8 9.9* 9.3*  HCT 36.1 28.4* 26.3*  PLT 205 158 161   BMET Recent Labs    10/29/22 1931 10/30/22 0623 10/31/22 0116  NA 128* 130* 125*  K 3.2* 2.9* 3.5  CL 87* 93* 90*  CO2 23 21* 23  GLUCOSE 93 77 134*  BUN 5* <5* <5*  CREATININE 0.54 0.51 0.64  CALCIUM 8.8* 7.9* 8.2*   LFT Recent Labs    10/31/22 0116  PROT 5.4*  ALBUMIN 1.9*  AST 555*  ALT 134*  ALKPHOS 288*  BILITOT 24.9*   PT/INR Recent Labs    10/29/22 2116 10/31/22 0116  LABPROT 17.1* 19.2*  INR 1.4* 1.6*   Hepatitis Panel Recent Labs    10/29/22 2116  HEPBSAG NON REACTIVE  HCVAB NON REACTIVE  HEPAIGM NON REACTIVE  HEPBIGM NON REACTIVE    IR Paracentesis  Result Date: 10/30/2022 INDICATION: Alcohol abuse admitted for abdominal swelling and jaundice. Imaging shows  small volume ascites. Interventional radiology asked to perform a diagnostic and therapeutic paracentesis. EXAM: ULTRASOUND GUIDED PARACENTESIS MEDICATIONS: 1% lidocaine 15 mL COMPLICATIONS: None immediate. PROCEDURE: Informed written consent was obtained from the patient after a discussion of the risks, benefits and alternatives to treatment. A timeout was performed prior to the initiation of the procedure. Initial ultrasound scanning demonstrates a small amount of ascites within the right lower abdominal quadrant. The right lower abdomen was prepped and draped in the usual sterile fashion. 1% lidocaine was used for local anesthesia. Following this, a 19 gauge, 10-cm, Yueh catheter was introduced. An ultrasound image was saved for documentation purposes. The paracentesis was performed. The catheter was removed and a dressing was applied. The patient tolerated the procedure well without immediate post procedural complication. FINDINGS: A total of approximately 200 mL of bright yellow fluid was removed. Samples were sent to the laboratory as requested by the clinical team. IMPRESSION: Successful ultrasound-guided paracentesis yielding 200 mL of peritoneal fluid. Procedure performed by Alwyn Ren NP and supervised by Dr. Grace Isaac. PLAN: If the patient eventually requires >/=2 paracenteses in a 30 day period, candidacy for formal evaluation by the [facility] Hypert[facility] will be assessed. Electronically Signed   By: Simonne Come M.D.   On: 10/30/2022 13:49  CT ABDOMEN PELVIS W CONTRAST  Result Date: 10/29/2022 CLINICAL DATA:  Abdominal pain, nausea, and vomiting. Swollen legs and feet, abnormal bowel movements, severe fatigue, and jaundice. EXAM: CT ABDOMEN AND PELVIS WITH CONTRAST TECHNIQUE: Multidetector CT imaging of the abdomen and pelvis was performed using the standard protocol following bolus administration of intravenous contrast. RADIATION DOSE REDUCTION: This exam  was performed according to the departmental dose-optimization program which includes automated exposure control, adjustment of the mA and/or kV according to patient size and/or use of iterative reconstruction technique. CONTRAST:  80mL OMNIPAQUE IOHEXOL 300 MG/ML  SOLN COMPARISON:  01/30/2023. FINDINGS: Lower chest: No acute abnormality. Hepatobiliary: Severe hepatic steatosis is noted. No focal abnormality. The gallbladder is without stones. There is suggestion of gallbladder wall thickening and mucosal enhancement with fat stranding at the gallbladder fossa. No biliary ductal dilatation. Pancreas: Unremarkable. No pancreatic ductal dilatation or surrounding inflammatory changes. Spleen: Normal in size without focal abnormality. Adrenals/Urinary Tract: The adrenal glands are within normal limits. The kidneys enhance symmetrically. No renal calculus or hydronephrosis. The bladder is unremarkable. Stomach/Bowel: Stomach is within normal limits. Appendix appears normal. No evidence of bowel obstruction. No free air or pneumatosis. Scattered diverticula are present along the colon without evidence of diverticulitis. There is mild diffuse colonic wall thickening. Vascular/Lymphatic: Aortic atherosclerosis. No enlarged abdominal or pelvic lymph nodes. Few perigastric and periesophageal varices are noted. Reproductive: The uterus is within normal limits. There is a 3.3 cm cyst in the right adnexa, likely ovarian in origin. No adnexal mass on the left. Other: Small ascites in all 4 quadrants.  Mild anasarca is noted. Musculoskeletal: No acute osseous abnormality. IMPRESSION: 1. Gallbladder wall thickening with mucosal enhancement. Ultrasound is recommended for further evaluation. 2. Severe hepatic steatosis. 3. Mild diffuse colonic wall thickening, possible colitis. 4. Mild ascites and anasarca. 5. Aortic atherosclerosis. Electronically Signed   By: Thornell Sartorius M.D.   On: 10/29/2022 21:57   DG Chest 2 View  Result  Date: 10/29/2022 CLINICAL DATA:  Shortness of breath. EXAM: CHEST - 2 VIEW COMPARISON:  None Available. FINDINGS: The heart size and mediastinal contours are within normal limits. Both lungs are clear. The visualized skeletal structures are unremarkable. IMPRESSION: No active cardiopulmonary disease. Electronically Signed   By: Elgie Collard M.D.   On: 10/29/2022 20:12    Assessment / Plan: *Likely acute alcohol hepatitis with LFTs significantly elevated, AST greater than 3 times ALT level.  Alcohol level on admission was 187.  Acute hepatitis studies negative.  Discriminant function of 52.  Started on prednisolone 40 mg daily on 7/31. *CT scan showing mild ascites and anasarca.  Paracentesis with 200 cc of fluid removed, negative for SBP.  Started on lasix 20 mg daily and aldactone 50 mg daily today as well. *Mild diffuse colonic wall thickening and gallbladder wall thickening likely due to anasarca/ascites and low albumin levels. *Hypokalemia with potassium of 2.9, corrected *Hyponatremia with sodium down to 125 today *Mild coagulopathy with an INR of 1.6. *Elevated ammonia at 68, but seems to be mentating normally, not started on lactulose *Macrocytic anemia:  Has folate deficiency:  Started on folic acid and thiamine as well.  Iron studies are high.  -Continue prednisolone and diuretics that were started today.   -Trend labs. -Await results of other serologies for other causes of chronic liver disease.   LOS: 1 day   Princella Pellegrini.   10/31/2022, 2:12 PM

## 2022-10-31 NOTE — Progress Notes (Signed)
PROGRESS NOTE    Tanya Goodman  YQM:[id number] DOB: 12/26/1985 DOA: 10/29/2022 PCP: Patient, No Pcp Per    Brief Narrative:  Tanya Goodman is a 37 y.o. female with history of alcohol use disorder presented to hospital with abdominal pain yellowish discoloration of her skin with lower extremity edema.  Patient drinks vodka 1/5 a day or a couple of bottles of wine.  She went through detox a few months ago and did well for almost a month, went through a break up and moving and that triggered her.    Started drinking again about 2 months ago.  For the last  month, she has also noticed increased abdominal girth, nausea vomiting.  In the ED, patient was noted to have bilirubin of 24, AST 726, ALT 169 with sodium of 128.  INR elevated at 1.6.  CT scan of the abdomen pelvis showed gallbladder wall thickening with severe hepatic steatosis with some mild ascites and anasarca.  Patient was then considered for admission to hospital for further evaluation and treatment.  Assessment and Plan:   Alcoholic hepatitis History of heavy alcohol abuse.  Significantly elevated total bilirubin level, elevated INR to evaluate AST and ALT elevation.  Initial bilirubin level at 28.  Discriminant factor is very high, poor prognosis. GI  on board. Status post Paracentesis without SBP.  Fluid cultures negative in 24 hours.  Liver workup about going on including autoimmune labs including ANA, mitochondrial antibodies, anti-smooth muscle antibody.  Hepatitis panel was negative.  Ferritin level high.  Patient has been started on prednisolone..  No evidence of hepatic encephalopathy.  Follow GI recommendations.   ETOH dependence ETOH 187 on presentation, CIWA protocol.  Continue Folate, thiamine, and MVI .  Continue symptom-triggered BZD (ativan per CIWA protocol).  TOC consultation for assistance with substance abuse..  Folate deficiency.  Will continue replacement.   Anemia Occasional history of bleeding from rectum.   Will continue to monitor CBC and transfuse as necessary.  Latest hemoglobin of 9.3.  Initial hemoglobin on presentation was 12.8.  GI on board.  Might need endoscopic evaluation at some point.  Significant hypokalemia on presentation.  Has improved.  Will continue to replenish.  Check BMP in AM.     DVT prophylaxis: SCDs Start: 10/30/22 0953   Code Status:     Code Status: Full Code  Disposition: Uncertain at this time.  Likely home in 2 to 3 days.  Status is: Inpatient  Remains inpatient appropriate because: Alcoholic hepatitis, on prednisolone, pending clinical improvement,   Family Communication: Spoke with the patient at bedside.    Consultants:  GI  Procedures:  Ultrasound-guided paracentesis on 10/30/2022.  Antimicrobials:  None  Anti-infectives (From admission, onward)    None        Subjective: Today, patient was seen and examined at bedside.  Patient overall feels okay.  Denies any hallucinations or excessive sweating.  Denies any nausea vomiting or abdominal pain but mild discomfort.  Objective: Vitals:   10/30/22 2151 10/30/22 2305 10/31/22 0421 10/31/22 0832  BP: (!) 131/97 (!) 128/90 108/68 116/88  Pulse: (!) 115 (!) 122 86 (!) 104  Resp: 14 14 18    Temp:  98 F (36.7 C) 98.4 F (36.9 C) 98.2 F (36.8 C)  TempSrc:  Oral Oral   SpO2: 93% 92% 94% 94%  Weight:      Height:        Intake/Output Summary (Last 24 hours) at 10/31/2022 1100 Last data filed at 10/31/2022 0900  Gross per 24 hour  Intake 600 ml  Output 0 ml  Net 600 ml   Filed Weights   10/29/22 1911  Weight: 86.2 kg    Physical Examination: Body mass index is 32.62 kg/m.  General:  obese built, not in obvious distress HENT:   Icterus noted.  Oral mucosa is moist.  Chest:  Clear breath sounds.  Diminished breath sounds bilaterally.  CVS: S1 &S2 heard. No murmur.  Regular rate and rhythm. Abdomen: Soft, nontender, distended abdomen with nonspecific diffuse tenderness bowel sounds  are heard.   Extremities: No cyanosis, clubbing with pitting edema bilateral lower extremities.  Peripheral pulses are palpable. Psych: Alert, awake and oriented, normal mood CNS:  No cranial nerve deficits.  Power equal in all extremities.   Skin: Warm and dry.  No rashes noted.  Deep icterus noted.  Data Reviewed:   CBC: Recent Labs  Lab 10/29/22 1931 10/30/22 0623 10/31/22 0116  WBC 5.9 4.9 4.3  NEUTROABS  --  2.9  --   HGB 12.8 9.9* 9.3*  HCT 36.1 28.4* 26.3*  MCV 102.3* 104.0* 106.9*  PLT 205 158 161    Basic Metabolic Panel: Recent Labs  Lab 10/29/22 1931 10/29/22 2116 10/30/22 0623 10/31/22 0116  NA 128*  --  130* 125*  K 3.2*  --  2.9* 3.5  CL 87*  --  93* 90*  CO2 23  --  21* 23  GLUCOSE 93  --  77 134*  BUN 5*  --  <5* <5*  CREATININE 0.54  --  0.51 0.64  CALCIUM 8.8*  --  7.9* 8.2*  MG  --  2.0  --   --     Liver Function Tests: Recent Labs  Lab 10/29/22 1931 10/30/22 0623 10/31/22 0116  AST 726* 593* 555*  ALT 169* 129* 134*  ALKPHOS 373* 305* 288*  BILITOT 28.4* 21.5* 24.9*  PROT 6.4* 5.3* 5.4*  ALBUMIN 3.2* 1.9* 1.9*     Radiology Studies: IR Paracentesis  Result Date: 10/30/2022 INDICATION: Alcohol abuse admitted for abdominal swelling and jaundice. Imaging shows small volume ascites. Interventional radiology asked to perform a diagnostic and therapeutic paracentesis. EXAM: ULTRASOUND GUIDED PARACENTESIS MEDICATIONS: 1% lidocaine 15 mL COMPLICATIONS: None immediate. PROCEDURE: Informed written consent was obtained from the patient after a discussion of the risks, benefits and alternatives to treatment. A timeout was performed prior to the initiation of the procedure. Initial ultrasound scanning demonstrates a small amount of ascites within the right lower abdominal quadrant. The right lower abdomen was prepped and draped in the usual sterile fashion. 1% lidocaine was used for local anesthesia. Following this, a 19 gauge, 10-cm, Yueh catheter  was introduced. An ultrasound image was saved for documentation purposes. The paracentesis was performed. The catheter was removed and a dressing was applied. The patient tolerated the procedure well without immediate post procedural complication. FINDINGS: A total of approximately 200 mL of bright yellow fluid was removed. Samples were sent to the laboratory as requested by the clinical team. IMPRESSION: Successful ultrasound-guided paracentesis yielding 200 mL of peritoneal fluid. Procedure performed by Alwyn Ren NP and supervised by Dr. Grace Isaac. PLAN: If the patient eventually requires >/=2 paracenteses in a 30 day period, candidacy for formal evaluation by the [facility] Interventional Radiology Portal Hypertension Clinic will be assessed. Electronically Signed   By: Simonne Come M.D.   On: 10/30/2022 13:49   CT ABDOMEN PELVIS W CONTRAST  Result Date: 10/29/2022 CLINICAL DATA:  Abdominal pain, nausea, and vomiting. Swollen  legs and feet, abnormal bowel movements, severe fatigue, and jaundice. EXAM: CT ABDOMEN AND PELVIS WITH CONTRAST TECHNIQUE: Multidetector CT imaging of the abdomen and pelvis was performed using the standard protocol following bolus administration of intravenous contrast. RADIATION DOSE REDUCTION: This exam was performed according to the departmental dose-optimization program which includes automated exposure control, adjustment of the mA and/or kV according to patient size and/or use of iterative reconstruction technique. CONTRAST:  80mL OMNIPAQUE IOHEXOL 300 MG/ML  SOLN COMPARISON:  01/30/2023. FINDINGS: Lower chest: No acute abnormality. Hepatobiliary: Severe hepatic steatosis is noted. No focal abnormality. The gallbladder is without stones. There is suggestion of gallbladder wall thickening and mucosal enhancement with fat stranding at the gallbladder fossa. No biliary ductal dilatation. Pancreas: Unremarkable. No pancreatic ductal dilatation or surrounding inflammatory changes.  Spleen: Normal in size without focal abnormality. Adrenals/Urinary Tract: The adrenal glands are within normal limits. The kidneys enhance symmetrically. No renal calculus or hydronephrosis. The bladder is unremarkable. Stomach/Bowel: Stomach is within normal limits. Appendix appears normal. No evidence of bowel obstruction. No free air or pneumatosis. Scattered diverticula are present along the colon without evidence of diverticulitis. There is mild diffuse colonic wall thickening. Vascular/Lymphatic: Aortic atherosclerosis. No enlarged abdominal or pelvic lymph nodes. Few perigastric and periesophageal varices are noted. Reproductive: The uterus is within normal limits. There is a 3.3 cm cyst in the right adnexa, likely ovarian in origin. No adnexal mass on the left. Other: Small ascites in all 4 quadrants.  Mild anasarca is noted. Musculoskeletal: No acute osseous abnormality. IMPRESSION: 1. Gallbladder wall thickening with mucosal enhancement. Ultrasound is recommended for further evaluation. 2. Severe hepatic steatosis. 3. Mild diffuse colonic wall thickening, possible colitis. 4. Mild ascites and anasarca. 5. Aortic atherosclerosis. Electronically Signed   By: Thornell Sartorius M.D.   On: 10/29/2022 21:57   DG Chest 2 View  Result Date: 10/29/2022 CLINICAL DATA:  Shortness of breath. EXAM: CHEST - 2 VIEW COMPARISON:  None Available. FINDINGS: The heart size and mediastinal contours are within normal limits. Both lungs are clear. The visualized skeletal structures are unremarkable. IMPRESSION: No active cardiopulmonary disease. Electronically Signed   By: Elgie Collard M.D.   On: 10/29/2022 20:12      LOS: 1 day   Joycelyn Das, MD [facility] Available via Epic secure chat 7am-7pm After these hours, please refer to coverage provider listed on [email or web address] 10/31/2022, 11:00 AM

## 2022-10-31 NOTE — Hospital Course (Addendum)
Mrs.Navarette was admitted to the hospital with the working diagnosis of acute on chronic liver failure, acute alcoholic hepatitis.   37 y.o. female with history of alcohol use disorder presented to hospital with abdominal pain yellowish discoloration of her skin with lower extremity edema.  Patient drinks vodka 1/5 a day or a couple of bottles of wine.  She went through detox a few months ago and did well for almost a month, went through a break up and moving and that triggered her.    Started drinking again about 2 months ago.  For the last  month she has also noticed increased abdominal girth, nausea vomiting.  In the ED patient was noted to have bilirubin of 24, AST 726, ALT 169 with sodium of 128.  INR elevated at 1.6.  CT scan of the abdomen pelvis showed gallbladder wall thickening with severe hepatic steatosis with some mild ascites and anasarca.  Patient was then considered for admission to hospital for further evaluation and treatment. presentation was 12.8.  GI on board.  Patient placed on medical therapy including systemic corticosteroids for acute on chronic liver failure.   08/05 patient clinically improving. AST and ALT trending down. Bilirubin slowly improving as well.

## 2022-10-31 NOTE — Plan of Care (Signed)
  Problem: Nutrition: Goal: Adequate nutrition will be maintained Outcome: Progressing   Problem: Education: Goal: Knowledge of General Education information will improve Description: Including pain rating scale, medication(s)/side effects and non-pharmacologic comfort measures Outcome: Progressing   Problem: Clinical Measurements: Goal: Respiratory complications will improve Outcome: Progressing Goal: Cardiovascular complication will be avoided Outcome: Progressing   Problem: Activity: Goal: Risk for activity intolerance will decrease Outcome: Progressing   Problem: Nutrition: Goal: Adequate nutrition will be maintained Outcome: Progressing   Problem: Elimination: Goal: Will not experience complications related to bowel motility Outcome: Progressing Goal: Will not experience complications related to urinary retention Outcome: Progressing

## 2022-10-31 NOTE — Progress Notes (Signed)
   10/31/22 1610  Provider Notification  Provider Name/Title Dr. Loney Loh  Date Provider Notified 10/31/22  Time Provider Notified [phone]  Method of Notification Page  Notification Reason Critical Result  Test performed and critical result Total Bilirubin 24.9  Date Critical Result Received 10/31/22  Provider response No new orders   Dr. Loney Loh made aware.  No new orders received.  Bernie Covey RN

## 2022-10-31 NOTE — TOC Benefit Eligibility Note (Signed)
Pharmacy Patient Advocate Encounter  Insurance verification completed.    The patient is insured through  [employer] test claim for prednisolone 5 mg tablets and the current 30 day cash price is $3,544.58.   This test claim was processed through [facility]- copay amounts may vary at other pharmacies due to pharmacy/plan contracts, or as the patient moves through the different stages of their insurance plan.    Roland Earl, CPHT Pharmacy Patient Advocate Specialist [facility] Patient Advocate Team Direct Number: [phone]  Fax: [phone]

## 2022-11-01 DIAGNOSIS — E871 Hypo-osmolality and hyponatremia: Secondary | ICD-10-CM | POA: Diagnosis not present

## 2022-11-01 DIAGNOSIS — R17 Unspecified jaundice: Secondary | ICD-10-CM | POA: Diagnosis not present

## 2022-11-01 DIAGNOSIS — D649 Anemia, unspecified: Secondary | ICD-10-CM | POA: Diagnosis not present

## 2022-11-01 DIAGNOSIS — K759 Inflammatory liver disease, unspecified: Secondary | ICD-10-CM | POA: Diagnosis not present

## 2022-11-01 DIAGNOSIS — F1090 Alcohol use, unspecified, uncomplicated: Secondary | ICD-10-CM | POA: Diagnosis not present

## 2022-11-01 DIAGNOSIS — K7011 Alcoholic hepatitis with ascites: Secondary | ICD-10-CM | POA: Diagnosis not present

## 2022-11-01 DIAGNOSIS — E876 Hypokalemia: Secondary | ICD-10-CM | POA: Diagnosis not present

## 2022-11-01 MED ORDER — SPIRONOLACTONE 25 MG PO TABS
100.0000 mg | ORAL_TABLET | Freq: Every day | ORAL | Status: DC
  Administered 2022-11-02 – 2022-11-07 (×6): 100 mg via ORAL
  Filled 2022-11-01 (×6): qty 4

## 2022-11-01 MED ORDER — FUROSEMIDE 40 MG PO TABS
40.0000 mg | ORAL_TABLET | Freq: Every day | ORAL | Status: DC
  Administered 2022-11-02 – 2022-11-07 (×6): 40 mg via ORAL
  Filled 2022-11-01 (×6): qty 1

## 2022-11-01 MED ORDER — POTASSIUM CHLORIDE CRYS ER 20 MEQ PO TBCR
40.0000 meq | EXTENDED_RELEASE_TABLET | ORAL | Status: AC
  Administered 2022-11-01 (×2): 40 meq via ORAL
  Filled 2022-11-01 (×2): qty 2

## 2022-11-01 MED ORDER — MAGNESIUM SULFATE 2 GM/50ML IV SOLN
2.0000 g | Freq: Once | INTRAVENOUS | Status: AC
  Administered 2022-11-01: 2 g via INTRAVENOUS
  Filled 2022-11-01: qty 50

## 2022-11-01 MED ORDER — MAGNESIUM OXIDE -MG SUPPLEMENT 400 (240 MG) MG PO TABS
400.0000 mg | ORAL_TABLET | Freq: Two times a day (BID) | ORAL | Status: DC
  Administered 2022-11-01 – 2022-11-07 (×13): 400 mg via ORAL
  Filled 2022-11-01 (×13): qty 1

## 2022-11-01 NOTE — Progress Notes (Signed)
PROGRESS NOTE    Tanya Goodman  ION:[id number] DOB: 04-08-1985 DOA: 10/29/2022 PCP: Patient, No Pcp Per    Brief Narrative:  DAMA Tanya Goodman is a 37 y.o. female with history of alcohol use disorder presented to hospital with abdominal pain yellowish discoloration of her skin with lower extremity edema.  Patient drinks vodka 1/5 a day or a couple of bottles of wine.  She went through detox a few months ago and did well for almost a month, went through a break up and moving and that triggered her.    Started drinking again about 2 months ago.  For the last  month, she has also noticed increased abdominal girth, nausea vomiting.  In the ED, patient was noted to have bilirubin of 24, AST 726, ALT 169 with sodium of 128.  INR elevated at 1.6.  CT scan of the abdomen pelvis showed gallbladder wall thickening with severe hepatic steatosis with some mild ascites and anasarca.  Patient was then considered for admission to the hospital for further evaluation and treatment.  Assessment and Plan:   Alcoholic hepatitis with coagulopathy History of heavy alcohol abuse.  Significantly elevated total bilirubin level, elevated INR , AST and ALT elevation.  Initial bilirubin level at 28.  Discriminant factor is very high, poor prognosis. GI  on board. Status post paracentesis without SBP.  Fluid cultures negative in 2 days.  Serologies negative.  Hemochromatosis PCR pending.  Hepatitis panel was negative.  Ferritin level high.  Patient has been started on prednisolone.  No evidence of hepatic encephalopathy.  Continue oral Lasix and spironolactone.  On vitamin K 10 mg p.o. daily for 3 days.  Ammonia high but without encephalopathy.  Follow GI recommendations.   ETOH dependence ETOH 187 on presentation, CIWA protocol.  Continue Folate, thiamine, and MVI .  Continue symptom-triggered BZD (ativan per CIWA protocol).  TOC consultation for assistance with substance abuse..  No overt signs of withdrawal.  Folate  deficiency.  Will continue replacement.   Macrocytic anemia Occasional history of bleeding from rectum.  Will continue to monitor CBC and transfuse as necessary.  No bleeding reported recently.  Latest hemoglobin of 8.9 from initial presentation at 12.8.    Might need endoscopic evaluation at some point.  Continue thiamine and folic acid.  Hypokalemia.  Potassium 3.1 today.  Will replenish with 2 doses of p.o. potassium.  Check levels in AM.  Continue to replenish as necessary.  Add magnesium oxide as well.  Will give 2 g of IV magnesium sulfate for magnesium of 1.8 today.  Cigarette smoking.  On nicotine patch.  Counseling done.     DVT prophylaxis: SCDs Start: 10/30/22 0953   Code Status:     Code Status: Full Code  Disposition: Uncertain at this time.  Likely home in 2 to 3 days, pending clinical improvement.  Status is: Inpatient  Remains inpatient appropriate because: Alcoholic hepatitis, on prednisolone, pending clinical improvement,   Family Communication: Spoke with the patient at bedside.    Consultants:  GI  Procedures:  Ultrasound-guided paracentesis on 10/30/2022.  Antimicrobials:  None  Anti-infectives (From admission, onward)    None      Subjective: Today, patient was seen and examined at bedside.  Patient denies any blood per rectum.  Has less abdominal pain.  No nausea vomiting fever chills or rigor.  Feels that she could not sleep well in the nighttime due to disturbance.  Objective: Vitals:   10/31/22 1800 10/31/22 2058 11/01/22 0348 11/01/22  0937  BP:  122/87 114/89 111/80  Pulse: (!) 107 (!) 112 (!) 112 (!) 116  Resp:  16 16 15   Temp:  98.5 F (36.9 C) 98.1 F (36.7 C) 98.5 F (36.9 C)  TempSrc:      SpO2:  94% 99% 95%  Weight:      Height:        Intake/Output Summary (Last 24 hours) at 11/01/2022 1250 Last data filed at 11/01/2022 0839 Gross per 24 hour  Intake 720 ml  Output 0 ml  Net 720 ml   Filed Weights   10/29/22 1911  Weight:  86.2 kg    Physical Examination: Body mass index is 32.62 kg/m.   General:  obese built, not in obvious distress, alert awake and Communicative. HENT:   Deep icterus noted.  Oral mucosa is moist.  Chest:  Clear breath sounds.  No wheezes or crackles noted. CVS: S1 &S2 heard. No murmur.  Regular rate and rhythm. Abdomen: Soft, nontender, distended abdomen with nonspecific diffuse tenderness, bowel sounds are heard.   Extremities: No cyanosis, clubbing with pitting edema bilateral lower extremities.  Peripheral pulses are palpable. Psych: Alert, awake and oriented, normal mood CNS:  No cranial nerve deficits.  Power equal in all extremities.   Skin: Warm and dry.  No rashes noted.  Deep icterus noted.  Data Reviewed:   CBC: Recent Labs  Lab 10/29/22 1931 10/30/22 0623 10/31/22 0116 11/01/22 0339  WBC 5.9 4.9 4.3 6.9  NEUTROABS  --  2.9  --   --   HGB 12.8 9.9* 9.3* 8.9*  HCT 36.1 28.4* 26.3* 26.9*  MCV 102.3* 104.0* 106.9* 112.1*  PLT 205 158 161 184    Basic Metabolic Panel: Recent Labs  Lab 10/29/22 1931 10/29/22 2116 10/30/22 0623 10/31/22 0116 11/01/22 0339  NA 128*  --  130* 125* 132*  K 3.2*  --  2.9* 3.5 3.1*  CL 87*  --  93* 90* 96*  CO2 23  --  21* 23 25  GLUCOSE 93  --  77 134* 90  BUN 5*  --  <5* <5* <5*  CREATININE 0.54  --  0.51 0.64 0.62  CALCIUM 8.8*  --  7.9* 8.2* 8.5*  MG  --  2.0  --   --  1.8    Liver Function Tests: Recent Labs  Lab 10/29/22 1931 10/30/22 0623 10/31/22 0116 11/01/22 0339  AST 726* 593* 555* 445*  ALT 169* 129* 134* 110*  ALKPHOS 373* 305* 288* 295*  BILITOT 28.4* 21.5* 24.9* 24.2*  PROT 6.4* 5.3* 5.4* 5.0*  ALBUMIN 3.2* 1.9* 1.9* 1.8*     Radiology Studies: IR Paracentesis  Result Date: 10/30/2022 INDICATION: Alcohol abuse admitted for abdominal swelling and jaundice. Imaging shows small volume ascites. Interventional radiology asked to perform a diagnostic and therapeutic paracentesis. EXAM: ULTRASOUND GUIDED  PARACENTESIS MEDICATIONS: 1% lidocaine 15 mL COMPLICATIONS: None immediate. PROCEDURE: Informed written consent was obtained from the patient after a discussion of the risks, benefits and alternatives to treatment. A timeout was performed prior to the initiation of the procedure. Initial ultrasound scanning demonstrates a small amount of ascites within the right lower abdominal quadrant. The right lower abdomen was prepped and draped in the usual sterile fashion. 1% lidocaine was used for local anesthesia. Following this, a 19 gauge, 10-cm, Yueh catheter was introduced. An ultrasound image was saved for documentation purposes. The paracentesis was performed. The catheter was removed and a dressing was applied. The patient tolerated the procedure  well without immediate post procedural complication. FINDINGS: A total of approximately 200 mL of bright yellow fluid was removed. Samples were sent to the laboratory as requested by the clinical team. IMPRESSION: Successful ultrasound-guided paracentesis yielding 200 mL of peritoneal fluid. Procedure performed by Alwyn Ren NP and supervised by Dr. Grace Isaac. PLAN: If the patient eventually requires >/=2 paracenteses in a 30 day period, candidacy for formal evaluation by the [facility] Interventional Radiology Portal Hypertension Clinic will be assessed. Electronically Signed   By: Simonne Come M.D.   On: 10/30/2022 13:49      LOS: 2 days   Joycelyn Das, MD [facility] Available via Epic secure chat 7am-7pm After these hours, please refer to coverage provider listed on [email or web address] 11/01/2022, 12:50 PM

## 2022-11-01 NOTE — TOC Progression Note (Signed)
Transition of Care [facility]) - Progression Note    Patient Details  Name: Tanya Goodman MRN: [id number] Date of Birth: Jan 19, 1986  [facility]) CM/SW Contact  Tom-Johnson, Hershal Coria, RN Phone Number: 11/01/2022, 1:12 PM  Clinical Narrative:     Patient continues on Prednisolone and Diuretics. Underwent Paracentesis on 10/30/22 with removal of 200 mL of Peritoneal fluid. GI following  CM will continue to follow as patient progresses with care towards discharge.        Expected Discharge Plan and Services                                               Social Determinants of Health (SDOH) Interventions SDOH Screenings   Food Insecurity: Food Insecurity Present (07/09/2022)  Housing: Medium Risk (10/30/2022)  Transportation Needs: No Transportation Needs (10/30/2022)  Utilities: At Risk (10/30/2022)  Depression (PHQ2-9): High Risk (07/13/2022)  Tobacco Use: High Risk (10/29/2022)    Readmission Risk Interventions    10/30/2022   11:10 AM  Readmission Risk Prevention Plan  Post Dischage Appt Complete  Medication Screening Complete  Transportation Screening Complete

## 2022-11-01 NOTE — Progress Notes (Signed)
[facility] Progress Note  CC:  Jaundice, hepatitis   Subjective:  She is sleepy today.  Says there was a lot of noise and commotion on the floor last night.  Abdominal pain is better.  Enjoyed her birthday lunch with her mom yesterday.  Had a BM here with no blood in it.  Objective:  Vital signs in last 24 hours: Temp:  [97.8 F (36.6 C)-98.5 F (36.9 C)] 98.5 F (36.9 C) (08/02 0937) Pulse Rate:  [107-116] 116 (08/02 0937) Resp:  [15-16] 15 (08/02 0937) BP: (111-126)/(80-89) 111/80 (08/02 0937) SpO2:  [94 %-99 %] 95 % (08/02 0937) Last BM Date : 10/30/22 General:  Alert, deep jaundice noted, in NAD Heart:   Tachy but regular rhythm; no murmurs Pulm:  CTAB.  No W/R/R. Abdomen:  Soft, somewhat distended.  BS present.  Mild TTP. Extremities:  2-3+ pitting edema noted in B/L LEs, somewhat up to the thighs. Neurologic:  Alert and oriented x 4;  grossly normal neurologically. Psych:  Alert and cooperative. Normal mood and affect.  Intake/Output from previous day: 08/01 0701 - 08/02 0700 In: 1080 [P.O.:1080] Out: 0  Intake/Output this shift: Total I/O In: 240 [P.O.:240] Out: 0   Lab Results: Recent Labs    10/30/22 0623 10/31/22 0116 11/01/22 0339  WBC 4.9 4.3 6.9  HGB 9.9* 9.3* 8.9*  HCT 28.4* 26.3* 26.9*  PLT 158 161 184   BMET Recent Labs    10/30/22 0623 10/31/22 0116 11/01/22 0339  NA 130* 125* 132*  K 2.9* 3.5 3.1*  CL 93* 90* 96*  CO2 21* 23 25  GLUCOSE 77 134* 90  BUN <5* <5* <5*  CREATININE 0.51 0.64 0.62  CALCIUM 7.9* 8.2* 8.5*   LFT Recent Labs    11/01/22 0339  PROT 5.0*  ALBUMIN 1.8*  AST 445*  ALT 110*  ALKPHOS 295*  BILITOT 24.2*   PT/INR Recent Labs    10/31/22 0116 11/01/22 0339  LABPROT 19.2* 20.8*  INR 1.6* 1.8*   Hepatitis Panel Recent Labs    10/29/22 2116  HEPBSAG NON REACTIVE  HCVAB NON REACTIVE  HEPAIGM NON REACTIVE  HEPBIGM NON REACTIVE    IR Paracentesis  Result Date:  10/30/2022 INDICATION: Alcohol abuse admitted for abdominal swelling and jaundice. Imaging shows small volume ascites. Interventional radiology asked to perform a diagnostic and therapeutic paracentesis. EXAM: ULTRASOUND GUIDED PARACENTESIS MEDICATIONS: 1% lidocaine 15 mL COMPLICATIONS: None immediate. PROCEDURE: Informed written consent was obtained from the patient after a discussion of the risks, benefits and alternatives to treatment. A timeout was performed prior to the initiation of the procedure. Initial ultrasound scanning demonstrates a small amount of ascites within the right lower abdominal quadrant. The right lower abdomen was prepped and draped in the usual sterile fashion. 1% lidocaine was used for local anesthesia. Following this, a 19 gauge, 10-cm, Yueh catheter was introduced. An ultrasound image was saved for documentation purposes. The paracentesis was performed. The catheter was removed and a dressing was applied. The patient tolerated the procedure well without immediate post procedural complication. FINDINGS: A total of approximately 200 mL of bright yellow fluid was removed. Samples were sent to the laboratory as requested by the clinical team. IMPRESSION: Successful ultrasound-guided paracentesis yielding 200 mL of peritoneal fluid. Procedure performed by Alwyn Ren NP and supervised by Dr. Grace Isaac. PLAN: If the patient eventually requires >/=2 paracenteses in a 30 day period, candidacy for formal evaluation by the [facility] will be  assessed. Electronically Signed   By: Simonne Come M.D.   On: 10/30/2022 13:49    Assessment / Plan: *Likely acute alcohol hepatitis with LFTs significantly elevated, AST greater than 3 times ALT level.  Alcohol level on admission was 187.  Acute hepatitis studies negative.  Discriminant function of 52.  Started on prednisolone 40 mg daily on 7/31.  Serologies for other causes of chronic liver disease are  negative.  Awaiting hemochromatosis genetic testing results since iron saturation was very high. *CT scan showing mild ascites and anasarca.  Paracentesis with 200 cc of fluid removed, negative for SBP.  Started on lasix 20 mg daily and aldactone 50 mg daily on 8/1 as well. *Mild diffuse colonic wall thickening and gallbladder wall thickening likely due to anasarca/ascites and low albumin levels. *Hypokalemia with potassium 3.1 today *Hyponatremia with sodium better at 132 today *Mild coagulopathy with an INR of 1.8.  Vitamin K ordered 10 mg PO daily x 3 days. *Elevated ammonia at 68, but seems to be mentating normally, not started on lactulose *Macrocytic anemia:  Has folate deficiency:  Started on folic acid and thiamine as well.  Iron studies are high.   -Continue prednisolone and diuretics that were started.  May need to try to titrate the diuretics up further as she still has a good amount of LE edema. -Trend labs. -Will need to check a Lille score at day 7 on 8/6 to see if prednisolone should be continued for the full 28-day course.     LOS: 2 days   Princella Pellegrini.   11/01/2022, 10:15 AM

## 2022-11-02 DIAGNOSIS — F1029 Alcohol dependence with unspecified alcohol-induced disorder: Secondary | ICD-10-CM

## 2022-11-02 DIAGNOSIS — F1021 Alcohol dependence, in remission: Secondary | ICD-10-CM | POA: Insufficient documentation

## 2022-11-02 DIAGNOSIS — F1020 Alcohol dependence, uncomplicated: Secondary | ICD-10-CM

## 2022-11-02 DIAGNOSIS — E876 Hypokalemia: Secondary | ICD-10-CM | POA: Diagnosis not present

## 2022-11-02 DIAGNOSIS — E669 Obesity, unspecified: Secondary | ICD-10-CM | POA: Diagnosis not present

## 2022-11-02 DIAGNOSIS — R17 Unspecified jaundice: Secondary | ICD-10-CM | POA: Diagnosis not present

## 2022-11-02 DIAGNOSIS — E871 Hypo-osmolality and hyponatremia: Secondary | ICD-10-CM | POA: Diagnosis not present

## 2022-11-02 DIAGNOSIS — K7011 Alcoholic hepatitis with ascites: Secondary | ICD-10-CM | POA: Diagnosis not present

## 2022-11-02 DIAGNOSIS — D649 Anemia, unspecified: Secondary | ICD-10-CM | POA: Diagnosis not present

## 2022-11-02 DIAGNOSIS — K759 Inflammatory liver disease, unspecified: Secondary | ICD-10-CM | POA: Diagnosis not present

## 2022-11-02 LAB — COMPREHENSIVE METABOLIC PANEL WITH GFR
ALT: 98 U/L — ABNORMAL HIGH (ref 0–44)
AST: 357 U/L — ABNORMAL HIGH (ref 15–41)
Albumin: 1.7 g/dL — ABNORMAL LOW (ref 3.5–5.0)
Alkaline Phosphatase: 261 U/L — ABNORMAL HIGH (ref 38–126)
Anion gap: 8 (ref 5–15)
BUN: 5 mg/dL — ABNORMAL LOW (ref 6–20)
CO2: 26 mmol/L (ref 22–32)
Calcium: 8.1 mg/dL — ABNORMAL LOW (ref 8.9–10.3)
Chloride: 97 mmol/L — ABNORMAL LOW (ref 98–111)
Creatinine, Ser: 0.61 mg/dL (ref 0.44–1.00)
GFR, Estimated: >60 mL/min
Glucose, Bld: 88 mg/dL (ref 70–99)
Potassium: 3.8 mmol/L (ref 3.5–5.1)
Sodium: 131 mmol/L — ABNORMAL LOW (ref 135–145)
Total Bilirubin: 24.9 mg/dL (ref 0.3–1.2)
Total Protein: 4.7 g/dL — ABNORMAL LOW (ref 6.5–8.1)

## 2022-11-02 MED ORDER — LORAZEPAM 1 MG PO TABS
1.0000 mg | ORAL_TABLET | ORAL | Status: DC | PRN
  Administered 2022-11-02 – 2022-11-07 (×15): 1 mg via ORAL
  Filled 2022-11-02 (×15): qty 1

## 2022-11-02 MED ORDER — LACTULOSE 10 GM/15ML PO SOLN
30.0000 g | Freq: Three times a day (TID) | ORAL | Status: DC
  Administered 2022-11-02 – 2022-11-04 (×7): 30 g via ORAL
  Filled 2022-11-02 (×7): qty 45

## 2022-11-02 NOTE — Progress Notes (Signed)
     [facility] Progress Note  CC:  Alcoholic hepatitis  Subjective: Patient is tired today.  Patient has been urinating well.  She does feel like her abdominal pain has improved significantly.  Objective:  Vital signs in last 24 hours: Temp:  [97.6 F (36.4 C)-99.1 F (37.3 C)] 97.6 F (36.4 C) (08/03 0908) Pulse Rate:  [96-116] 110 (08/03 0908) Resp:  [15-20] 15 (08/03 0908) BP: (102-124)/(69-85) 119/82 (08/03 0908) SpO2:  [95 %-96 %] 95 % (08/03 0908) Last BM Date : 11/02/22 General:  Alert, deep jaundice noted, in NAD Heart:  tachycardic Pulm:  no increased WOB Abdomen:  Soft, somewhat distended.  Mild TTP. Extremities:  2-3+ pitting edema noted in B/L LEs  Intake/Output from previous day: 08/02 0701 - 08/03 0700 In: 720 [P.O.:720] Out: 0  Intake/Output this shift: Total I/O In: 840 [P.O.:840] Out: -   Lab Results: Recent Labs    10/31/22 0116 11/01/22 0339 11/02/22 0051  WBC 4.3 6.9 7.6  HGB 9.3* 8.9* 9.0*  HCT 26.3* 26.9* 26.6*  PLT 161 184 221   BMET Recent Labs    10/31/22 0116 11/01/22 0339 11/02/22 0051  NA 125* 132* 131*  K 3.5 3.1* 3.8  CL 90* 96* 97*  CO2 23 25 26   GLUCOSE 134* 90 88  BUN <5* <5* 5*  CREATININE 0.64 0.62 0.61  CALCIUM 8.2* 8.5* 8.1*   LFT Recent Labs    11/02/22 0051  PROT 4.7*  ALBUMIN 1.7*  AST 357*  ALT 98*  ALKPHOS 261*  BILITOT 24.9*   PT/INR Recent Labs    10/31/22 0116 11/01/22 0339  LABPROT 19.2* 20.8*  INR 1.6* 1.8*   Hepatitis Panel No results for input(s): "HEPBSAG", "HCVAB", "HEPAIGM", "HEPBIGM" in the last 72 hours.   No results found.  Assessment / Plan: *Likely acute alcohol hepatitis with LFTs significantly elevated, AST greater than 3 times ALT level.  Alcohol level on admission was 187.  Acute hepatitis studies negative.  Discriminant function of 52.  Started on prednisolone 40 mg daily on 7/31.  Serologies for other causes of chronic liver disease are negative.   Awaiting hemochromatosis genetic testing results since iron saturation was very high. *CT scan showing mild ascites and anasarca.  Paracentesis with 200 cc of fluid removed, negative for SBP.   *Mild diffuse colonic wall thickening and gallbladder wall thickening likely due to anasarca/ascites and low albumin levels. *Hyponatremia with sodium better at 131 today *Macrocytic anemia:  Has folate deficiency:  Continue folic acid and thiamine as well.  Iron studies are high.   -Continue prednisolone and diuretics that were started.  Will plan to check a Lille score at day 7 on 8/6. -Continue Lasix 40 mg every day and spironolactone 100 mg every day.  Patient's labs appear to be tolerating these diuretics at this time -Completed 3 days of vitamin K therapy   LOS: 3 days   Imogene Burn  11/02/2022, 1:16 PM

## 2022-11-02 NOTE — Assessment & Plan Note (Addendum)
High discriminant factor.  Coagulopathy Sp paracentesis, negative for SBP.  Serologies negative.  Viral hepatitis panel negative. Hemochromatosis PCR pending.   AST 255 ALT 80 T Bilirubin 28.1  INR 1,3   Plan to continue medical therapy with prednisone 40 mg daily.  Diuretic therapy with furosemide and spironolactone.  Lactulose decrease to bid Follow up liver profile in am.

## 2022-11-02 NOTE — Assessment & Plan Note (Signed)
Hgb has been stable at 9,0 Plt 221 and wbc 7,6   Plan to continue close follow up.   Iron panel with serum iron 160, ferritin 2,498, transferrin saturation 103 and TIBC 155.

## 2022-11-02 NOTE — Plan of Care (Signed)
  Problem: Nutrition: Goal: Adequate nutrition will be maintained Outcome: Progressing   Problem: Clinical Measurements: Goal: Respiratory complications will improve Outcome: Progressing Goal: Cardiovascular complication will be avoided Outcome: Progressing   Problem: Activity: Goal: Risk for activity intolerance will decrease Outcome: Progressing   Problem: Nutrition: Goal: Adequate nutrition will be maintained Outcome: Progressing   Problem: Coping: Goal: Level of anxiety will decrease Outcome: Progressing   Problem: Elimination: Goal: Will not experience complications related to bowel motility Outcome: Progressing Goal: Will not experience complications related to urinary retention Outcome: Progressing   Problem: Pain Managment: Goal: General experience of comfort will improve Outcome: Progressing

## 2022-11-02 NOTE — Progress Notes (Addendum)
Progress Note   Patient: Tanya Goodman RUE:[id number] DOB: 09/28/1985 DOA: 10/29/2022     3 DOS: the patient was seen and examined on 11/02/2022   Brief hospital course: Mrs.Uriegas was admitted to the hospital with the working diagnosis of acute on chronic liver failure, acute alcoholic hepatitis.   37 y.o. female with history of alcohol use disorder presented to hospital with abdominal pain yellowish discoloration of her skin with lower extremity edema.  Patient drinks vodka 1/5 a day or a couple of bottles of wine.  She went through detox a few months ago and did well for almost a month, went through a break up and moving and that triggered her.    Started drinking again about 2 months ago.  For the last  month she has also noticed increased abdominal girth, nausea vomiting.  In the ED patient was noted to have bilirubin of 24, AST 726, ALT 169 with sodium of 128.  INR elevated at 1.6.  CT scan of the abdomen pelvis showed gallbladder wall thickening with severe hepatic steatosis with some mild ascites and anasarca.  Patient was then considered for admission to hospital for further evaluation and treatment. presentation was 12.8.  GI on board.  Patient placed on medical therapy for acute on chronic liver failure.    Assessment and Plan: * Alcoholic hepatitis High discriminant factor.  Coagulopathy Sp paracentesis, negative for SBP.  Serologies negative.  Viral hepatitis panel negative. Hemochromatosis PCR pending.  INR 1.8   Today with persistent icterus, edema and positive asterixis.  AST 357 ALT 98 T Bilirubin 24.9   Plan to continue medical therapy with prednisone.  Diuretic therapy with furosemide and spironolactone.  Will add lactulose tid.   Hypokalemia Hyponatremia.   Renal function with serum cr at 0,61, K is 3,8 and serum bicarbonate at 26, Na 131, Mg 1.9.  Plan to continue diuresis with furosemide and spironolactone.  Add unna boots. Noted low albumin at 1,7.   Nutritional support.   Anemia Hgb has been stable at 9,0 Plt 221 and wbc 7,6   Plan to continue close follow up.   Iron panel with serum iron 160, ferritin 2,498, transferrin saturation 103 and TIBC 155.    Alcohol dependence (HCC) Tobacco abuse.   No clinical sings of withdrawal. She has been tachycardic, 90 to 100 bpm, sinus rhythm, personally reviewed telemetry. Last few days has been tachycardic, if no further tachycardia today, will plan to discontinue telemetry tomorrow. Ok patient to shower. Continue neuro checks per unit protocol.  Patient not longer on CIWA protocol, will add as needed lorazepam for anxiety.  Smoking cessation counseling.   Class 1 obesity Calculated BMI is 32.6    Subjective: Patient somnolent this am but easy to arouse, no confusion or agitation, no significant abdominal pain, no nausea or vomiting, no anxiety.   Physical Exam: Vitals:   11/01/22 1959 11/02/22 0125 11/02/22 0441 11/02/22 0908  BP: 102/85 124/85 103/69 119/82  Pulse: (!) 116 (!) 101 96 (!) 110  Resp: 20 18 18 15   Temp: 99 F (37.2 C) 99.1 F (37.3 C) 99.1 F (37.3 C) 97.6 F (36.4 C)  TempSrc: Oral     SpO2: 95% 95% 96% 95%  Weight:      Height:       Neurology somnolent but easy to arouse, follows commands and responds to questions.  Positive asterixis. ENT with pallor and icterus Cardiovascular with S1 and S2 present and rhythmic with no gallops, rubs or  murmurs Respiratory with no rales or wheezing, no rhonchi Abdomen with mild distention with no tenderness to superficial palpation with no rebound or guarding Positive lower extremity edema ++ to +++ pitting.  Data Reviewed:    Family Communication: no family at the bedside   Disposition: Status is: Inpatient Remains inpatient appropriate because: liver failure   Planned Discharge Destination: Home     Author: Coralie Keens, MD 11/02/2022 10:19 AM  For on call review [email or web address].

## 2022-11-02 NOTE — Assessment & Plan Note (Signed)
Hyponatremia.   Renal function with serum cr at 0,61, K is 3,8 and serum bicarbonate at 26, Na 131, Mg 1.9.  Plan to continue diuresis with furosemide and spironolactone.  Add unna boots. Noted low albumin at 1,7.  Nutritional support.

## 2022-11-02 NOTE — Assessment & Plan Note (Signed)
Tobacco abuse.   No clinical sings of withdrawal. She has been tachycardic, 90 to 100 bpm, sinus rhythm, personally reviewed telemetry. Last few days has been tachycardic, if no further tachycardia today, will plan to discontinue telemetry tomorrow. Ok patient to shower. Continue neuro checks per unit protocol.  Patient not longer on CIWA protocol, will add as needed lorazepam for anxiety.  Smoking cessation counseling.

## 2022-11-02 NOTE — Assessment & Plan Note (Signed)
" >>  ASSESSMENT AND PLAN FOR ALCOHOL DEPENDENCE (HCC) WRITTEN ON 11/03/2022  4:05 PM BY ARRIEN, MAURICIO DANIEL, MD  Tobacco abuse.   No clinical sings of withdrawal. Continue neuro checks per unit protocol.  Patient not longer on CIWA protocol. Continue with as needed lorazepam  for anxiety.  Smoking cessation counseling.   Ok to discontinue telemetry.  "

## 2022-11-02 NOTE — Assessment & Plan Note (Signed)
Calculated BMI is 32.6

## 2022-11-02 NOTE — Evaluation (Signed)
Physical Therapy Evaluation Patient Details Name: Tanya Goodman MRN: [id number] DOB: [birth year] Today's Date: 11/02/2022  History of Present Illness  37 y.o. female presents to [facility] hospital on 10/29/2022 with acute on chronic liver failure and acute alcoholic hepatitis. Paracentesis on 7/31. PMH includes alcohol use disorder.  Clinical Impression  Pt presents to PT mobilizing independently. Pt is able to tolerate multiple dynamic gait and balance tasks without significant deviation. Pt is tachycardic when mobilizing but does not report significant exertion during tasks. PT encourages continued mobilization during admission however further PT services are not warranted at this time. Acute PT signing off.        If plan is discharge home, recommend the following:     Can travel by private vehicle        Equipment Recommendations None recommended by PT  Recommendations for Other Services       Functional Status Assessment Patient has had a recent decline in their functional status and demonstrates the ability to make significant improvements in function in a reasonable and predictable amount of time.     Precautions / Restrictions Precautions Precautions: None Restrictions Weight Bearing Restrictions: No      Mobility  Bed Mobility Overal bed mobility: Independent                  Transfers Overall transfer level: Independent                      Ambulation/Gait Ambulation/Gait assistance: Independent Gait Distance (Feet): 300 Feet Assistive device: None Gait Pattern/deviations: WFL(Within Functional Limits) Gait velocity: functional Gait velocity interpretation: >2.62 ft/sec, indicative of community ambulatory   General Gait Details: steady step-through gait, pt tolerates head turns, changes in gait speed and stride length, and backward walking without loss of balance. One mild lateral loss of balance near end of session but pt corrects with stepping  strategy  Stairs Stairs: Yes Stairs assistance: Modified independent (Device/Increase time) Stair Management: One rail Right, Alternating pattern Number of Stairs: 10    Wheelchair Mobility     Tilt Bed    Modified Rankin (Stroke Patients Only)       Balance Overall balance assessment: No apparent balance deficits (not formally assessed) (rhomberg eyes closed for 30 seconds without LOB)                                           Pertinent Vitals/Pain Pain Assessment Pain Assessment: 0-10 Pain Score: 4  Pain Location: low back Pain Descriptors / Indicators: Aching Pain Intervention(s): Monitored during session    Home Living Family/patient expects to be discharged to:: Private residence Living Arrangements: Parent Available Help at Discharge: Family;Available PRN/intermittently Type of Home: House Home Access: Stairs to enter Entrance Stairs-Rails: Can reach both Entrance Stairs-Number of Steps: 3   Home Layout: One level Home Equipment: None      Prior Function Prior Level of Function : Independent/Modified Independent;Driving                     Hand Dominance        Extremity/Trunk Assessment   Upper Extremity Assessment Upper Extremity Assessment: Overall WFL for tasks assessed    Lower Extremity Assessment Lower Extremity Assessment: Overall WFL for tasks assessed    Cervical / Trunk Assessment Cervical / Trunk Assessment: Normal  Communication   Communication: No  difficulties  Cognition Arousal/Alertness: Awake/alert Behavior During Therapy: WFL for tasks assessed/performed Overall Cognitive Status: Within Functional Limits for tasks assessed                                          General Comments General comments (skin integrity, edema, etc.): tachy into 120s during activity    Exercises     Assessment/Plan    PT Assessment Patient does not need any further PT services  PT Problem List          PT Treatment Interventions      PT Goals (Current goals can be found in the Care Plan section)       Frequency       Co-evaluation               AM-PAC PT "6 Clicks" Mobility  Outcome Measure Help needed turning from your back to your side while in a flat bed without using bedrails?: None Help needed moving from lying on your back to sitting on the side of a flat bed without using bedrails?: None Help needed moving to and from a bed to a chair (including a wheelchair)?: None Help needed standing up from a chair using your arms (e.g., wheelchair or bedside chair)?: None Help needed to walk in hospital room?: None Help needed climbing 3-5 steps with a railing? : None 6 Click Score: 24    End of Session   Activity Tolerance: Patient tolerated treatment well Patient left: in bed;with call bell/phone within reach Nurse Communication: Mobility status PT Visit Diagnosis: Other abnormalities of gait and mobility (R26.89)    Time: 1610-9604 PT Time Calculation (min) (ACUTE ONLY): 10 min   Charges:   PT Evaluation $PT Eval Low Complexity: 1 Low   PT General Charges $$ ACUTE PT VISIT: 1 Visit         Arlyss Gandy, PT, DPT Acute Rehabilitation Office [phone]   Arlyss Gandy 11/02/2022, 4:40 PM

## 2022-11-03 DIAGNOSIS — K7011 Alcoholic hepatitis with ascites: Secondary | ICD-10-CM | POA: Diagnosis not present

## 2022-11-03 DIAGNOSIS — E876 Hypokalemia: Secondary | ICD-10-CM | POA: Diagnosis not present

## 2022-11-03 DIAGNOSIS — D649 Anemia, unspecified: Secondary | ICD-10-CM | POA: Diagnosis not present

## 2022-11-03 DIAGNOSIS — R17 Unspecified jaundice: Secondary | ICD-10-CM | POA: Diagnosis not present

## 2022-11-03 DIAGNOSIS — E669 Obesity, unspecified: Secondary | ICD-10-CM | POA: Diagnosis not present

## 2022-11-03 DIAGNOSIS — E871 Hypo-osmolality and hyponatremia: Secondary | ICD-10-CM | POA: Diagnosis not present

## 2022-11-03 DIAGNOSIS — K759 Inflammatory liver disease, unspecified: Secondary | ICD-10-CM | POA: Diagnosis not present

## 2022-11-03 LAB — COMPREHENSIVE METABOLIC PANEL WITH GFR
ALT: 95 U/L — ABNORMAL HIGH (ref 0–44)
AST: 309 U/L — ABNORMAL HIGH (ref 15–41)
Albumin: 1.8 g/dL — ABNORMAL LOW (ref 3.5–5.0)
Alkaline Phosphatase: 262 U/L — ABNORMAL HIGH (ref 38–126)
Anion gap: 9 (ref 5–15)
BUN: 5 mg/dL — ABNORMAL LOW (ref 6–20)
CO2: 27 mmol/L (ref 22–32)
Calcium: 8.1 mg/dL — ABNORMAL LOW (ref 8.9–10.3)
Chloride: 95 mmol/L — ABNORMAL LOW (ref 98–111)
Creatinine, Ser: 0.65 mg/dL (ref 0.44–1.00)
GFR, Estimated: >60 mL/min (ref >60)
Glucose, Bld: 92 mg/dL (ref 70–99)
Potassium: 3.2 mmol/L — ABNORMAL LOW (ref 3.5–5.1)
Sodium: 131 mmol/L — ABNORMAL LOW (ref 135–145)
Total Bilirubin: 28.3 mg/dL (ref 0.3–1.2)
Total Protein: 5.1 g/dL — ABNORMAL LOW (ref 6.5–8.1)

## 2022-11-03 LAB — PROTIME-INR
INR: 1.4 — ABNORMAL HIGH (ref 0.8–1.2)
Prothrombin Time: 17.2 s — ABNORMAL HIGH (ref 11.4–15.2)

## 2022-11-03 MED ORDER — POTASSIUM CHLORIDE CRYS ER 20 MEQ PO TBCR
40.0000 meq | EXTENDED_RELEASE_TABLET | ORAL | Status: AC
  Administered 2022-11-03 (×2): 40 meq via ORAL
  Filled 2022-11-03 (×2): qty 2

## 2022-11-03 NOTE — Progress Notes (Signed)
     [facility] Progress Note  CC:  Alcoholic hepatitis  Subjective: Patient does think that her abdomen is more distended today than yesterday.  Objective:  Vital signs in last 24 hours: Temp:  [98.5 F (36.9 C)-98.7 F (37.1 C)] 98.5 F (36.9 C) (08/04 0839) Pulse Rate:  [100-110] 103 (08/04 0839) Resp:  [18-19] 18 (08/04 0839) BP: (115-119)/(76-89) 115/76 (08/04 0839) SpO2:  [93 %-96 %] 96 % (08/04 0839) Last BM Date : 11/02/22 General:  Alert, deep jaundice noted, in NAD Heart:  tachycardic Pulm:  no increased WOB Abdomen:  Soft, mildly distended.  Mild TTP. Extremities:  2+ pitting edema noted in B/L LEs but improving  Intake/Output from previous day: 08/03 0701 - 08/04 0700 In: 1620 [P.O.:1620] Out: -  Intake/Output this shift: Total I/O In: 1020 [P.O.:1020] Out: -   Lab Results: Recent Labs    11/01/22 0339 11/02/22 0051  WBC 6.9 7.6  HGB 8.9* 9.0*  HCT 26.9* 26.6*  PLT 184 221   BMET Recent Labs    11/01/22 0339 11/02/22 0051 11/03/22 0207  NA 132* 131* 131*  K 3.1* 3.8 3.2*  CL 96* 97* 95*  CO2 25 26 27   GLUCOSE 90 88 92  BUN <5* 5* 5*  CREATININE 0.62 0.61 0.65  CALCIUM 8.5* 8.1* 8.1*   LFT Recent Labs    11/03/22 0207  PROT 5.1*  ALBUMIN 1.8*  AST 309*  ALT 95*  ALKPHOS 262*  BILITOT 28.3*   PT/INR Recent Labs    11/01/22 0339 11/03/22 0207  LABPROT 20.8* 17.2*  INR 1.8* 1.4*   Hepatitis Panel No results for input(s): "HEPBSAG", "HCVAB", "HEPAIGM", "HEPBIGM" in the last 72 hours.   No results found.  Assessment / Plan: *Likely acute alcohol hepatitis with LFTs significantly elevated, AST greater than 3 times ALT level.  Alcohol level on admission was 187.  Acute hepatitis studies negative.  Discriminant function of 52.  Started on prednisolone 40 mg daily on 7/31.  Serologies for other causes of chronic liver disease are negative.  Awaiting hemochromatosis genetic testing results since iron saturation was  very high. *CT scan showing mild ascites and anasarca.  Paracentesis 7/31 with 200 cc of fluid removed, negative for SBP.   *Mild diffuse colonic wall thickening and gallbladder wall thickening likely due to anasarca/ascites and low albumin levels. *Hyponatremia with sodium 131 today *Macrocytic anemia:  Has folate deficiency:  Continue folic acid and thiamine as well.  Iron studies are high.  -Continue to trend LFTs and INR.  Total bilirubin has increased today but INR has decreased slightly.  Will continue to monitor -Continue prednisolone and diuretics that were started.  Will plan to check a Lille score at day 7 on 8/6. -Continue Lasix 40 mg every day and spironolactone 100 mg every day.  Patient's labs appear to be tolerating these diuretics at this time -Completed 3 days of vitamin K therapy   LOS: 4 days   Imogene Burn  11/03/2022, 12:15 PM

## 2022-11-03 NOTE — Evaluation (Signed)
Occupational Therapy Evaluation and Discharge Patient Details Name: Tanya Goodman MRN: [id number] DOB: 17-Oct-1985 Today's Date: 11/03/2022   History of Present Illness Pt is a 37 y.o. female presents to [facility] hospital on 10/29/2022 with acute on chronic liver failure and acute alcoholic hepatitis. Paracentesis on 7/31. PMH includes alcohol use disorder.   Clinical Impression   At baseline, pt is Independent with ADLs, IADLs, and functional mobility without an AD. Pt reports she does not currently drive or work outside the home. Pt now presents with mildly decreased activity tolerance and edematous B feet leading pt to require increased time with functional tasks. Pt with B UE strength, ROM, and coordination WNL and no balance deficits noted during functional tasks in sitting and standing. Pt demonstrates ability to complete all ADLs and functional mobility without an AD Independent to Mod I and is within 90% of baseline PLOF. Pt reports her only concern regarding functional activities at home or in the community is finding people to help her mover her things into her mother's house. As pt is near baseline and able to complete ADLs and functional mobility/transfers without assistance, there is no further benefit to acute skilled OT services at this time. Post acute discharge, no OT follow up is indicated at this time. OT signing off.      Recommendations for follow up therapy are one component of a multi-disciplinary discharge planning process, led by the attending physician.  Recommendations may be updated based on patient status, additional functional criteria and insurance authorization.   Assistance Recommended at Discharge PRN  Patient can return home with the following Other (comment);Assist for transportation (PRN assist for meal prep and home management tasks)    Functional Status Assessment  Patient has not had a recent decline in their functional status  Equipment Recommendations  None  recommended by OT    Recommendations for Other Services       Precautions / Restrictions Precautions Precautions: None Restrictions Weight Bearing Restrictions: No      Mobility Bed Mobility Overal bed mobility: Independent                  Transfers Overall transfer level: Independent Equipment used: None                      Balance Overall balance assessment: No apparent balance deficits (not formally assessed)                                         ADL either performed or assessed with clinical judgement   ADL Overall ADL's : Modified independent;Independent                                       General ADL Comments: Pt demonstrates ability to complete all ADLs Independent to Mod I and is within 90% of baseline PLOF. Pt funcitonal level currently midly limited by pain due to B edematous feet and mildly decreased activity tolerance, requiring pt to perform ADLs with increased time.     Vision Baseline Vision/History: 1 Wears glasses Ability to See in Adequate Light: 0 Adequate Patient Visual Report: No change from baseline       Perception     Praxis      Pertinent Vitals/Pain Pain Assessment Pain Assessment: Faces Faces  Pain Scale: Hurts even more Pain Location: B feet, headache Pain Descriptors / Indicators: Aching, Discomfort (like her feet are going to explode) Pain Intervention(s): Limited activity within patient's tolerance, Monitored during session, Repositioned, Other (comment) (Notified RN)     Hand Dominance Right   Extremity/Trunk Assessment Upper Extremity Assessment Upper Extremity Assessment: Overall WFL for tasks assessed   Lower Extremity Assessment Lower Extremity Assessment: RLE deficits/detail;Defer to PT evaluation RLE Deficits / Details: mildly edematous B feet this session LLE Deficits / Details: mildly edematous B feet this session   Cervical / Trunk Assessment Cervical /  Trunk Assessment: Normal   Communication Communication Communication: No difficulties   Cognition Arousal/Alertness: Lethargic Behavior During Therapy: WFL for tasks assessed/performed Overall Cognitive Status: Within Functional Limits for tasks assessed                                       General Comments  Pt reporting mild dizziness and light headness in sitting/standing. However, pt's BP WNL in all positions. Pt with increased HR into the 110s with activity.    Exercises     Shoulder Instructions      Home Living Family/patient expects to be discharged to:: Private residence Living Arrangements: Alone Available Help at Discharge: Family;Available PRN/intermittently Type of Home: House Home Access: Level entry     Home Layout: Two level;Able to live on main level with bedroom/bathroom     Bathroom Shower/Tub: Producer, television/film/video: Standard     Home Equipment: None   Additional Comments: Pt has been living alone but plans to move in with her mother at time of discharge. Informaiton regarding home set-up is based on pt's mother's house.      Prior Functioning/Environment Prior Level of Function : Independent/Modified Independent;Driving             Mobility Comments: Independent ADLs Comments: Indepenedent        OT Problem List:        OT Treatment/Interventions:      OT Goals(Current goals can be found in the care plan section) Acute Rehab OT Goals Patient Stated Goal: To move in with her mother and not have pain in her feet  OT Frequency:      Co-evaluation              AM-PAC OT "6 Clicks" Daily Activity     Outcome Measure Help from another person eating meals?: None Help from another person taking care of personal grooming?: None Help from another person toileting, which includes using toliet, bedpan, or urinal?: None Help from another person bathing (including washing, rinsing, drying)?: None Help from  another person to put on and taking off regular upper body clothing?: None Help from another person to put on and taking off regular lower body clothing?: None 6 Click Score: 24   End of Session Nurse Communication: Mobility status;Other (comment) (Painful and edematous B feet. Pt BP WNL. Pt with HR into 110s with activity this session. No acute OT services indicated at this time.)  Activity Tolerance: Patient tolerated treatment well Patient left: in bed;with call bell/phone within reach  OT Visit Diagnosis: Other (comment) (decreased activity tolerance)                Time: 0940-1007 OT Time Calculation (min): 27 min Charges:  OT General Charges $OT Visit: 1 Visit OT Evaluation $OT Eval Low Complexity:  1 Low   "[employer] M., OTR/L, [place] Acute Rehab [phone]   Lendon Colonel 11/03/2022, 12:33 PM

## 2022-11-03 NOTE — Plan of Care (Signed)
  Problem: Nutrition: Goal: Adequate nutrition will be maintained Outcome: Progressing   Problem: Education: Goal: Knowledge of General Education information will improve Description: Including pain rating scale, medication(s)/side effects and non-pharmacologic comfort measures Outcome: Progressing   Problem: Nutrition: Goal: Adequate nutrition will be maintained Outcome: Progressing   Problem: Coping: Goal: Level of anxiety will decrease Outcome: Progressing   Problem: Pain Managment: Goal: General experience of comfort will improve Outcome: Progressing   Problem: Safety: Goal: Ability to remain free from injury will improve Outcome: Progressing

## 2022-11-03 NOTE — Plan of Care (Signed)
  Problem: Clinical Measurements: Goal: Respiratory complications will improve Outcome: Progressing Goal: Cardiovascular complication will be avoided Outcome: Progressing   Problem: Activity: Goal: Risk for activity intolerance will decrease Outcome: Progressing   Problem: Nutrition: Goal: Adequate nutrition will be maintained Outcome: Progressing   Problem: Elimination: Goal: Will not experience complications related to bowel motility Outcome: Progressing Goal: Will not experience complications related to urinary retention Outcome: Progressing   

## 2022-11-03 NOTE — Progress Notes (Signed)
Progress Note   Patient: Tanya Goodman:[id number] DOB: 09/19/1985 DOA: 10/29/2022     4 DOS: the patient was seen and examined on 11/03/2022   Brief hospital course: Tanya Goodman was admitted to the hospital with the working diagnosis of acute on chronic liver failure, acute alcoholic hepatitis.   37 y.o. female with history of alcohol use disorder presented to hospital with abdominal pain yellowish discoloration of her skin with lower extremity edema.  Patient drinks vodka 1/5 a day or a couple of bottles of wine.  She went through detox a few months ago and did well for almost a month, went through a break up and moving and that triggered her.    Started drinking again about 2 months ago.  For the last  month she has also noticed increased abdominal girth, nausea vomiting.  In the ED patient was noted to have bilirubin of 24, AST 726, ALT 169 with sodium of 128.  INR elevated at 1.6.  CT scan of the abdomen pelvis showed gallbladder wall thickening with severe hepatic steatosis with some mild ascites and anasarca.  Patient was then considered for admission to hospital for further evaluation and treatment. presentation was 12.8.  GI on board.  Patient placed on medical therapy for acute on chronic liver failure.    Assessment and Plan: * Alcoholic hepatitis High discriminant factor.  Coagulopathy Sp paracentesis, negative for SBP.  Serologies negative.  Viral hepatitis panel negative. Hemochromatosis PCR pending.  INR 1.8   Today with persistent icterus, edema and positive asterixis.  AST 357 ALT 98 T Bilirubin 24.9   Plan to continue medical therapy with prednisone.  Diuretic therapy with furosemide and spironolactone.  Will add lactulose tid.   Hypokalemia Hyponatremia.   Renal function with serum cr at 0,61, K is 3,8 and serum bicarbonate at 26, Na 131, Mg 1.9.  Plan to continue diuresis with furosemide and spironolactone.  Add unna boots. Noted low albumin at 1,7.   Nutritional support.   Anemia Hgb has been stable at 9,0 Plt 221 and wbc 7,6   Plan to continue close follow up.   Iron panel with serum iron 160, ferritin 2,498, transferrin saturation 103 and TIBC 155.    Alcohol dependence (HCC) Tobacco abuse.   No clinical sings of withdrawal. She has been tachycardic, 90 to 100 bpm, sinus rhythm, personally reviewed telemetry. Last few days has been tachycardic, if no further tachycardia today, will plan to discontinue telemetry tomorrow. Ok patient to shower. Continue neuro checks per unit protocol.  Patient not longer on CIWA protocol, will add as needed lorazepam for anxiety.  Smoking cessation counseling.   Class 1 obesity Calculated BMI is 32.6         Subjective: Patient with bilateral feet pain, no nausea or vomiting, no abdominal pain.   Physical Exam: Vitals:   11/02/22 2200 11/02/22 2255 11/03/22 0530 11/03/22 0839  BP:  118/84 117/87 115/76  Pulse:  (!) 103 100 (!) 103  Resp: 19   18  Temp:  98.5 F (36.9 C) 98.7 F (37.1 C) 98.5 F (36.9 C)  TempSrc:  Oral Oral   SpO2:  93% 95% 96%  Weight:      Height:       Neurology awake and alert ENT with mild pallor and positive icterus Cardiovascular with S1 and S2 present and regular with no gallops or rubs Respiratory with no rales or wheezing Abdomen with no distention and non tender Lower extremity edema has improved,  today with pedal edema.  Data Reviewed:    Family Communication: no family at the bedside   Disposition: Status is: Inpatient Remains inpatient appropriate because: pending further improvement in liver injury   Planned Discharge Destination: Home      Author: Coralie Keens, MD 11/03/2022 4:00 PM  For on call review [email or web address].

## 2022-11-04 DIAGNOSIS — D649 Anemia, unspecified: Secondary | ICD-10-CM | POA: Diagnosis not present

## 2022-11-04 DIAGNOSIS — E876 Hypokalemia: Secondary | ICD-10-CM | POA: Diagnosis not present

## 2022-11-04 DIAGNOSIS — K7011 Alcoholic hepatitis with ascites: Secondary | ICD-10-CM | POA: Diagnosis not present

## 2022-11-04 DIAGNOSIS — F1029 Alcohol dependence with unspecified alcohol-induced disorder: Secondary | ICD-10-CM | POA: Diagnosis not present

## 2022-11-04 MED ORDER — LACTULOSE 10 GM/15ML PO SOLN: 30.0000 g | Freq: Every day | ORAL | Status: DC

## 2022-11-04 MED ORDER — LACTULOSE 10 GM/15ML PO SOLN: 30.0000 g | Freq: Two times a day (BID) | ORAL | Status: DC

## 2022-11-04 NOTE — Progress Notes (Signed)
Progress Note   Patient: Tanya Goodman DGU:[id number] DOB: November 27, 1985 DOA: 10/29/2022     5 DOS: the patient was seen and examined on 11/04/2022   Brief hospital course: Tanya Goodman was admitted to the hospital with the working diagnosis of acute on chronic liver failure, acute alcoholic hepatitis.   37 y.o. female with history of alcohol use disorder presented to hospital with abdominal pain yellowish discoloration of her skin with lower extremity edema.  Patient drinks vodka 1/5 a day or a couple of bottles of wine.  She went through detox a few months ago and did well for almost a month, went through a break up and moving and that triggered her.    Started drinking again about 2 months ago.  For the last  month she has also noticed increased abdominal girth, nausea vomiting.  In the ED patient was noted to have bilirubin of 24, AST 726, ALT 169 with sodium of 128.  INR elevated at 1.6.  CT scan of the abdomen pelvis showed gallbladder wall thickening with severe hepatic steatosis with some mild ascites and anasarca.  Patient was then considered for admission to hospital for further evaluation and treatment. presentation was 12.8.  GI on board.  Patient placed on medical therapy for acute on chronic liver failure.    Assessment and Plan: * Alcoholic hepatitis High discriminant factor.  Coagulopathy Sp paracentesis, negative for SBP.  Serologies negative.  Viral hepatitis panel negative. Hemochromatosis PCR pending.   AST 255 ALT 80 T Bilirubin 28.1  INR 1,3   Plan to continue medical therapy with prednisone 40 mg daily.  Diuretic therapy with furosemide and spironolactone.  Lactulose decrease to bid Follow up liver profile in am.   Hypokalemia Hyponatremia.   Continue to improve peripheral edema, no signs of worsening ascites.   Renal function with serum cr 0,58,K is 4,0 and serum bicarbonate at 23. Na 130 Mg 2.3   Plan to continue diuresis with furosemide and  spironolactone.   Follow up renal function and electrolytes in am.  Nutritional support.   Anemia Hgb has been stable at 9,0 Plt 221 and wbc 7,6   Plan to continue close follow up.   Iron panel with serum iron 160, ferritin 2,498, transferrin saturation 103 and TIBC 155.    Alcohol dependence (HCC) Tobacco abuse.   No clinical sings of withdrawal. Continue neuro checks per unit protocol.  Patient not longer on CIWA protocol. Continue with as needed lorazepam for anxiety.  Smoking cessation counseling.   Ok to discontinue telemetry.   Class 1 obesity Calculated BMI is 32.6         Subjective: Patient is feeling better, her pedal edema and abdominal pain continue to improved, no nausea or vomiting. No dyspnea.   Physical Exam: Vitals:   11/03/22 1644 11/03/22 2002 11/04/22 0409 11/04/22 0925  BP: 116/80 121/77 106/71 124/78  Pulse: 100 94 88 (!) 104  Resp: 18 20 19 18   Temp: 98.8 F (37.1 C) 97.7 F (36.5 C) 98.7 F (37.1 C) 98.3 F (36.8 C)  TempSrc:  Oral    SpO2: 96% 93%  95%  Weight:      Height:       Neurology awake and alert ENT with mild pallor and positive icterus (improving) Cardiovascular with S1 and S2 present and regular with no gallops, rubs or murmurs Respiratory with no rales or wheezing, no rhonchi Abdomen with no distention and non tender to superficial palpation, no rebound or guarding, no  significant ascites Pedal edema + to ++  Data Reviewed:    Family Communication: no family at the bedside   Disposition: Status is: Inpatient Remains inpatient appropriate because: pending improvement in hepatitis   Planned Discharge Destination: Home     Author: Coralie Keens, MD 11/04/2022 10:37 AM  For on call review [email or web address].

## 2022-11-04 NOTE — TOC Progression Note (Signed)
Transition of Care [facility]) - Progression Note    Patient Details  Name: Tanya Goodman MRN: [id number] Date of Birth: 1985-09-23  Transition of Care [facility] CM/SW Contact  Tom-Johnson, Hershal Coria, RN Phone Number: 11/04/2022, 4:32 PM  Clinical Narrative:     Patient continues on continue diuresis with Furosemide and Spironolactone. Acewrap ordered for BLE edema. No longer on CIWA protocol. GI following.   CM continues to follow as patient progresses towards discharge.    Expected Discharge Plan and Services                                               Social Determinants of Health (SDOH) Interventions SDOH Screenings   Food Insecurity: Food Insecurity Present (11/02/2022)  Housing: Medium Risk (10/30/2022)  Transportation Needs: No Transportation Needs (10/30/2022)  Utilities: At Risk (10/30/2022)  Depression (PHQ2-9): High Risk (07/13/2022)  Tobacco Use: High Risk (10/29/2022)    Readmission Risk Interventions    10/30/2022   11:10 AM  Readmission Risk Prevention Plan  Post Dischage Appt Complete  Medication Screening Complete  Transportation Screening Complete

## 2022-11-04 NOTE — Progress Notes (Signed)
Orthopedic Tech Progress Note Patient Details:  Tanya Goodman 1985/10/27 [id number]  Ortho Devices Type of Ortho Device: Radio broadcast assistant, Ace wrap Ortho Device/Splint Location: BLE Ortho Device/Splint Interventions: Ordered, Application   Post Interventions Patient Tolerated: Well Instructions Provided: Care of device  [name] 11/04/2022, 4:04 PM

## 2022-11-04 NOTE — Progress Notes (Addendum)
Daily Progress Note  DOA: 10/29/2022 Hospital Day: 7 Chief Complaint: Abnormal liver chemistries  ASSESSMENT & PLAN   Brief Narrative:  Tanya Goodman is a 37 y.o. year old female with a history of Etoh abuse admitted 7/31 with abdominal pain and jaundice.  Etoh abuse / suspected Etoh hepatitis with markedly elevated liver chemistries / ascites. Discriminant function of 52 on admission.  - s/p diagnostic paracentesis -Ascitic fluid negative for SBP.  -Continue lasix  40 mg / aldactone 100 mg daily  -Some improvement in INR post Vitamin K . -Ammonia 68 on admission. Continue  Lactulose but will decrease to once daily due to excessive BMs - ANA positive, remainder of other studies ordered were negative. Given age will add ceruloplasmin and alpha 1 anti-trypsinogen. Ferritin 2498, hemochromatosis DNA - PCR is pending -Started Prednisolone on 7/31. Will calculate Lille score on 8/7. Tbili still 28 today. Some improvement in liver enzymes. AST 255 / ALT 90. Normal mentation. INR stable  Hyponatremia.  -Na stable at 130.   Macrocytic anemia with folate deficiency.  May have some bone marrow suppression from Etoh.  -folic acid repletion in progress -History of heavy menses but hgb was normal in April 2024 -Hgb stable at 9.pril and no menses in two months.   ? Colitis on CT scan .  Asymptomatic. The diarrhea is most likely all from lactulose  ----------------------------------------------------------------------------------------------------    Tanya Goodman GI Attending   I have taken an interval history, reviewed the chart and examined the patient. I agree with the Advanced Practitioner's note, impression and recommendations w/ following additions:  Will stop lactulose - she is/has not exhibited signs of encephalopathy and his having sig diarrhea  Not looking good re: response to prednisolone but will reassess Wed 8/7 re: to continue prednisolone?  Abnormal wall on colon at CT  likely edema (portal colopathy) and not colitis   Tanya Boop, MD, [facility] See AMION on call - gastroenterology for best contact person 11/04/2022 2:53 PM  Subjective   No complaints other than having excessive BM with lactulose. Reports at least 10 BMs causing her to be up during the night.  Appetite is okay  Objective   Recent Labs    11/02/22 0051  WBC 7.6  HGB 9.0*  HCT 26.6*  PLT 221   BMET Recent Labs    11/02/22 0051 11/03/22 0207 11/04/22 0351  NA 131* 131* 130*  K 3.8 3.2* 4.0  CL 97* 95* 97*  CO2 26 27 23   GLUCOSE 88 92 81  BUN 5* 5* 8  CREATININE 0.61 0.65 0.58  CALCIUM 8.1* 8.1* 7.8*   LFT Recent Labs    11/04/22 0351  PROT 4.9*  ALBUMIN 1.7*  AST 255*  ALT 80*  ALKPHOS 234*  BILITOT 28.1*   PT/INR Recent Labs    11/03/22 0207 11/04/22 0351  LABPROT 17.2* 16.2*  INR 1.4* 1.3*     Imaging:  IR Paracentesis INDICATION: Alcohol abuse admitted for abdominal swelling and jaundice. Imaging shows small volume ascites. Interventional radiology asked to perform a diagnostic and therapeutic paracentesis.  EXAM: ULTRASOUND GUIDED PARACENTESIS  MEDICATIONS: 1% lidocaine 15 mL  COMPLICATIONS: None immediate.  PROCEDURE: Informed written consent was obtained from the patient after a discussion of the risks, benefits and alternatives to treatment. A timeout was performed prior to the initiation of the procedure.  Initial ultrasound scanning demonstrates a small amount of ascites within the right lower abdominal quadrant. The right lower abdomen  was prepped and draped in the usual sterile fashion. 1% lidocaine was used for local anesthesia.  Following this, a 19 gauge, 10-cm, Yueh catheter was introduced. An ultrasound image was saved for documentation purposes. The paracentesis was performed. The catheter was removed and a dressing was applied. The patient tolerated the procedure well without immediate post  procedural complication.  FINDINGS: A total of approximately 200 mL of bright yellow fluid was removed. Samples were sent to the laboratory as requested by the clinical team.  IMPRESSION: Successful ultrasound-guided paracentesis yielding 200 mL of peritoneal fluid.  Procedure performed by Tanya Ren NP and supervised by Dr. Grace Goodman.  PLAN: If the patient eventually requires >/=2 paracenteses in a 30 day period, candidacy for formal evaluation by the [facility] will be assessed.  Electronically Signed   By: Simonne Come M.D.   On: 10/30/2022 13:49     Scheduled inpatient medications:   folic acid  1 mg Oral Daily   furosemide  40 mg Oral Daily   lactulose  30 g Oral BID   magnesium oxide  400 mg Oral BID   multivitamin with minerals  1 tablet Oral Daily   nicotine  21 mg Transdermal QHS   pantoprazole  40 mg Oral Daily   prednisoLONE  40 mg Oral Daily   spironolactone  100 mg Oral Daily   thiamine  100 mg Oral Daily   Continuous inpatient infusions:  PRN inpatient medications: bisacodyl, LORazepam, ondansetron **OR** ondansetron (ZOFRAN) IV, oxyCODONE, polyethylene glycol  Vital signs in last 24 hours: Temp:  [97.7 F (36.5 C)-98.8 F (37.1 C)] 98.3 F (36.8 C) (08/05 0925) Pulse Rate:  [88-104] 104 (08/05 0925) Resp:  [18-20] 18 (08/05 0925) BP: (106-124)/(71-80) 124/78 (08/05 0925) SpO2:  [93 %-96 %] 95 % (08/05 0925) Last BM Date : 11/03/22  Intake/Output Summary (Last 24 hours) at 11/04/2022 1112 Last data filed at 11/03/2022 2245 Gross per 24 hour  Intake 660 ml  Output 0 ml  Net 660 ml    Intake/Output from previous day: 08/04 0701 - 08/05 0700 In: 1680 [P.O.:1680] Out: 0  Intake/Output this shift: No intake/output data recorded.   Physical Exam:  General: Alert female in NAD Heart:  Regular rate and rhythm.  Pulmonary: Normal respiratory effort Abdomen: Soft, protuberant, nontender. Normal  bowel sounds. Extremities: No lower extremity edema  Neurologic: Alert and oriented Psych: Pleasant. Cooperative. Insight appears normal.    Principal Problem:   Alcoholic hepatitis Active Problems:   Anemia   Hypokalemia   Alcohol dependence (HCC)   Class 1 obesity     LOS: 5 days   Tanya Cluster ,NP 11/04/2022, 11:12 AM

## 2022-11-05 DIAGNOSIS — K7011 Alcoholic hepatitis with ascites: Secondary | ICD-10-CM | POA: Diagnosis not present

## 2022-11-05 NOTE — Progress Notes (Addendum)
Daily Progress Note  DOA: 10/29/2022 Hospital Day: 8 Chief Complaint: Abnormal liver chemistries   ASSESSMENT & PLAN   Brief Narrative:  Tanya Goodman is a 37 y.o. year old female with a history of   Etoh abuse admitted 7/31 with abdominal pain and jaundice.   Etoh abuse / suspected Etoh hepatitis with markedly elevated liver chemistries / ascites. Discriminant function of 52 on admission.  - s/p diagnostic paracentesis -Ascitic fluid negative for SBP.  -Continue lasix  40 mg / aldactone 100 mg daily for now but monitor closely for ongoing need for diuretics. Has bilateral leg wraps in place for swelling but only mild ascites on imaging. Edema may be due in part to severe hypoalbuminemia? .  -On heart healthy diet -Some improvement in INR post Vitamin K . - Ammonia 68 on admission. No encephalopathy. Lactulose stopped due to excessive BMs - ANA positive, remainder of other autoimmune markers negative.  Added ceruloplasmin and alpha 1 anti-trypsinogen yesterday, results pending.  Ferritin 2498, hemochromatosis DNA - PCR is pending -Started Prednisolone on 7/31. Will calculate Lille score on 8/7. Still markedly cholestatic. Tbili 27.8, essentially unchanged . Liver enzymes continue to improve . Normal mentation.    Hyponatremia.  -Na stable at 131.    Macrocytic anemia with folate deficiency.  May have some bone marrow suppression from Etoh.  -folic acid repletion in progress - Hgb 9 at last check on 8/3.   ? Colitis on CT scan .  Asymptomatic. Diarrhea improving after discontinuation of lactulose. 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    ---------------------------------------------------------------------------------------------------    [facility] Attending   I have taken an interval history, reviewed the chart and examined the patient. I agree with the Advanced Practitioner's note, impression and recommendations.  Seems doubtful that we will  continue prednisolone  Have ordered dietitian visit re: low Na and higher protein diet  She should do a bedtime snack also as shown to be beneficial in liver failure  Will need to work on outpatient f/u - APP appt post dc  Iva Boop, MD, [facility] See AMION on call - gastroenterology for best contact person 11/05/2022 1:56 PM   Subjective   No complaints other than not sleeping well at night. Diarrhea improving after stopping lactulose   Objective   BMET Recent Labs    11/03/22 0207 11/04/22 0351 11/05/22 0421  NA 131* 130* 131*  K 3.2* 4.0 3.6  CL 95* 97* 98  CO2 27 23 25   GLUCOSE 92 81 79  BUN 5* 8 9  CREATININE 0.65 0.58 0.60  CALCIUM 8.1* 7.8* 8.0*   LFT Recent Labs    11/05/22 0421  PROT 4.8*  ALBUMIN 1.7*  AST 214*  ALT 73*  ALKPHOS 218*  BILITOT 27.9*   PT/INR Recent Labs    11/03/22 0207 11/04/22 0351  LABPROT 17.2* 16.2*  INR 1.4* 1.3*     Imaging:  IR Paracentesis INDICATION: Alcohol abuse admitted for abdominal swelling and jaundice. Imaging shows small volume ascites. Interventional radiology asked to perform a diagnostic and therapeutic paracentesis.  EXAM: ULTRASOUND GUIDED PARACENTESIS  MEDICATIONS: 1% lidocaine 15 mL  COMPLICATIONS: None immediate.  PROCEDURE: Informed written consent was obtained from the patient after a discussion of the risks, benefits and alternatives to treatment. A timeout was performed prior to the initiation of the procedure.  Initial ultrasound scanning demonstrates a small amount of ascites within the right lower abdominal quadrant. The right lower abdomen  was prepped and draped in the usual sterile fashion. 1% lidocaine was used for local anesthesia.  Following this, a 19 gauge, 10-cm, Yueh catheter was introduced. An ultrasound image was saved for documentation purposes. The paracentesis was performed. The catheter was removed and a dressing was applied. The  patient tolerated the procedure well without immediate post procedural complication.  FINDINGS: A total of approximately 200 mL of bright yellow fluid was removed. Samples were sent to the laboratory as requested by the clinical team.  IMPRESSION: Successful ultrasound-guided paracentesis yielding 200 mL of peritoneal fluid.  Procedure performed by Alwyn Ren NP and supervised by Dr. Grace Isaac.  PLAN: If the patient eventually requires >/=2 paracenteses in a 30 day period, candidacy for formal evaluation by the [facility] will be assessed.  Electronically Signed   By: Simonne Come M.D.   On: 10/30/2022 13:49     Scheduled inpatient medications:   folic acid  1 mg Oral Daily   furosemide  40 mg Oral Daily   magnesium oxide  400 mg Oral BID   multivitamin with minerals  1 tablet Oral Daily   nicotine  21 mg Transdermal QHS   pantoprazole  40 mg Oral Daily   prednisoLONE  40 mg Oral Daily   spironolactone  100 mg Oral Daily   thiamine  100 mg Oral Daily   Continuous inpatient infusions:  PRN inpatient medications: bisacodyl, LORazepam, ondansetron **OR** ondansetron (ZOFRAN) IV, oxyCODONE, polyethylene glycol  Vital signs in last 24 hours: Temp:  [97.7 F (36.5 C)-99 F (37.2 C)] 97.7 F (36.5 C) (08/06 0934) Pulse Rate:  [88-106] 106 (08/06 0934) Resp:  [17-19] 18 (08/06 0934) BP: (117-126)/(74-88) 118/80 (08/06 0934) SpO2:  [96 %-98 %] 96 % (08/06 0934) Last BM Date : 11/04/22  Intake/Output Summary (Last 24 hours) at 11/05/2022 1042 Last data filed at 11/05/2022 0300 Gross per 24 hour  Intake 720 ml  Output --  Net 720 ml    Intake/Output from previous day: 08/05 0701 - 08/06 0700 In: 720 [P.O.:720] Out: -  Intake/Output this shift: No intake/output data recorded.   Physical Exam:  General: Alert female in NAD Heart:  Regular rate and rhythm.  Pulmonary: Normal respiratory effort Abdomen: Soft,  nondistended, nontender. Normal bowel sounds. Extremities: Bilateral lower extremity leg wraps Neurologic: Alert and oriented. No asterixis Psych: Pleasant. Cooperative. Insight appears normal.    Principal Problem:   Alcoholic hepatitis Active Problems:   Anemia   Hypokalemia   Alcohol dependence (HCC)   Class 1 obesity     LOS: 6 days   Willette Cluster ,NP 11/05/2022, 10:42 AM

## 2022-11-05 NOTE — Progress Notes (Signed)
PROGRESS NOTE Tanya Goodman  WJX:[id number] DOB: 1985-08-06 DOA: 10/29/2022 PCP: Patient, No Pcp Per  Brief Narrative/Hospital Course: 37 y.o. female with history of alcohol use disorder presented to hospital with abdominal pain yellowish discoloration of her skin with lower extremity edema.   Patient drinks vodka 1/5 a day or a couple of bottles of wine.  She went through detox a few months ago and did well for almost a month, went through a break up and moving and that triggered her.    Started drinking again about 2 months ago.  For the last  month she has also noticed increased abdominal girth, nausea vomiting.  In the ED patient was noted to have bilirubin of 24, AST 726, ALT 169 with sodium of 128.  INR elevated at 1.6.  CT scan of the abdomen pelvis showed gallbladder wall thickening with severe hepatic steatosis with some mild ascites and anasarca.  She was admitted w/ working diagnosis of acute on chronic liver failure, acute alcoholic hepatitis.GI on board> placed on medical therapy including systemic corticosteroids.Patient clinically improving. AST and ALT trending down. Bilirubin slowly improving as well.  11/05/22; AST/ALT/TB at 214/73/27.9 from  peak of 726/169/28 on admission. INR stable.?  Colitis on CT scan but asymptomatic.    Subjective: Seen and examined Resting comfortably.  Complains of itching, no nausea or vomiting Legs in una boot, abdomen is full but not worse Having BM   Assessment and Plan: Principal Problem:   Alcoholic hepatitis Active Problems:   Hypokalemia   Anemia   Alcohol dependence (HCC)   Class 1 obesity  Alcohol abuse Suspected alcohol hepatitis with markedly elevated liver enzymes Ascites: Discrimination function of 1500 admission underwent further evaluation with diagnostic paracentesis, negative for SBP, GI following closely. ANA positive,  but remainder of other studies ordered per GI were negative.- also added ceruloplasmin and alpha 1  anti-trypsinogen. Ferritin 2498, hemochromatosis DNA - PCR.  Started on prednisolone 7/31, LFTs are slowly downtrending.  Plan is to calculate Lilly score on 8/7 Continue to trend LFTs Continue Lasix, Aldactone and lactulose Recent Labs  Lab 10/29/22 1931 10/29/22 2116 10/30/22 0623 10/31/22 0116 11/01/22 0339 11/02/22 0051 11/03/22 0207 11/04/22 0351 11/05/22 0421  AST 726*  --  593* 555* 445* 357* 309* 255* 214*  ALT 169*  --  129* 134* 110* 98* 95* 80* 73*  ALKPHOS 373*  --  305* 288* 295* 261* 262* 234* 218*  BILITOT 28.4*  --  21.5* 24.9* 24.2* 24.9* 28.3* 28.1* 27.9*  PROT 6.4*  --  5.3* 5.4* 5.0* 4.7* 5.1* 4.9* 4.8*  ALBUMIN 3.2*  --  1.9* 1.9* 1.8* 1.7* 1.8* 1.7* 1.7*  AMMONIA  --   --  68*  --   --   --   --   --   --   INR  --  1.4*  --  1.6* 1.8*  --  1.4* 1.3*  --   LIPASE 74*  --   --   --   --   --   --   --   --   PLT 205  --  158 161 184 221  --   --   --       Latest Ref Rng & Units 10/29/2022    9:16 PM  Hepatitis  Hep B Surface Ag NON REACTIVE NON REACTIVE   Hep B IgM NON REACTIVE NON REACTIVE   Hep C Ab NON REACTIVE NON REACTIVE   Hep A IgM NON REACTIVE  NON REACTIVE    MELD 3.0: 28 at 11/05/2022  4:21 AM MELD-Na: 26 at 11/05/2022  4:21 AM Calculated from: Serum Creatinine: 0.60 mg/dL (Using min of 1 mg/dL) at 04/06/1094  0:45 AM Serum Sodium: 131 mmol/L at 11/05/2022  4:21 AM Total Bilirubin: 27.9 mg/dL at 4/0/9811  9:14 AM Serum Albumin: 1.7 g/dL at 10/07/2954  2:13 AM INR(ratio): 1.3 at 11/04/2022  3:51 AM Age at listing (hypothetical): 37 years Sex: Female at 11/05/2022  4:21 AM    Hypokalemia Hyponatremia: Potassium improved sodium mildly low, stable  Macrocytic anemia: Suspect in the setting of alcoholic liver disease/alcohol abuse, anemia panel showed B12 folate on higher side, serum iron 116 with ferritin 2498., trend hb Recent Labs  Lab 10/29/22 1931 10/30/22 0623 10/31/22 0116 11/01/22 0339 11/02/22 0051  HGB 12.8 9.9* 9.3* 8.9* 9.0*  HCT 36.1  28.4* 26.3* 26.9* 26.6*    Alcohol dependence Tobacco abuse:  No signs of withdrawal, no longer on CIWA protocol, advised smoking cessation and alcohol cessation.    Class I Obesity:Patient's Body mass index is 32.62 kg/m. : Will benefit with PCP follow-up, weight loss  healthy lifestyle and outpatient sleep evaluation.   DVT prophylaxis: SCDs Start: 10/30/22 0953 Code Status:   Code Status: Full Code Family Communication: plan of care discussed with patient/ at bedside. Patient status is:  inpatient because of liver dysfunction Level of care: Med-Surg   Dispo: The patient is from: home            Anticipated disposition: home once cleared by GI in 24 hrs Objective: Vitals last 24 hrs: Vitals:   11/04/22 1736 11/04/22 2019 11/05/22 0445 11/05/22 0934  BP: 126/79 123/88 117/74 118/80  Pulse: (!) 104 97 88 (!) 106  Resp: 18 19 17 18   Temp: 98.3 F (36.8 C) 99 F (37.2 C) 98.9 F (37.2 C) 97.7 F (36.5 C)  TempSrc:  Oral Oral   SpO2: 96% 98% 97% 96%  Weight:      Height:       Weight change:   Physical Examination: General exam: alert awake, older than stated age HEENT:Oral mucosa moist, Ear/Nose WNL grossly Respiratory system: bilaterally clear BS, no use of accessory muscle Cardiovascular system: S1 & S2 +, No JVD. Gastrointestinal system: Abdomen soft, full NT,BS+ Nervous System:Alert, awake, moving extremities. Extremities: LE edema + w/ UNA boot,distal peripheral pulses palpable.  Skin: No rashes,no icterus. MSK: Normal muscle bulk,tone, power  Medications reviewed:  Scheduled Meds:  folic acid  1 mg Oral Daily   furosemide  40 mg Oral Daily   magnesium oxide  400 mg Oral BID   multivitamin with minerals  1 tablet Oral Daily   nicotine  21 mg Transdermal QHS   pantoprazole  40 mg Oral Daily   prednisoLONE  40 mg Oral Daily   spironolactone  100 mg Oral Daily   thiamine  100 mg Oral Daily   Continuous Infusions:    Diet Order             Diet Heart  Fluid consistency: Thin  Diet effective now                    Nutrition Problem: Altered GI function Etiology: vomiting, nausea, constipation, diarrhea Signs/Symptoms: per patient/family report Interventions: Refer to RD note for recommendations, MVI, Education   Intake/Output Summary (Last 24 hours) at 11/05/2022 0958 Last data filed at 11/05/2022 0300 Gross per 24 hour  Intake 720 ml  Output --  Net 720 ml   Net IO Since Admission: 6,560 mL [11/05/22 0958]  Wt Readings from Last 3 Encounters:  10/29/22 86.2 kg  09/16/17 86.2 kg     Unresulted Labs (From admission, onward)     Start     Ordered   11/04/22 1128  Ceruloplasmin  Add-on,   AD       Question:  Specimen collection method  Answer:  Lab=Lab collect   11/04/22 1127   11/04/22 1128  Alpha-1-antitrypsin  Add-on,   AD       Question:  Specimen collection method  Answer:  Lab=Lab collect   11/04/22 1127   10/31/22 1556  Hemochromatosis DNA-PCR(c282y,h63d)  Add-on,   AD        10/31/22 1555          Data Reviewed: I have personally reviewed following labs and imaging studies CBC: Recent Labs  Lab 10/29/22 1931 10/30/22 0623 10/31/22 0116 11/01/22 0339 11/02/22 0051  WBC 5.9 4.9 4.3 6.9 7.6  NEUTROABS  --  2.9  --   --   --   HGB 12.8 9.9* 9.3* 8.9* 9.0*  HCT 36.1 28.4* 26.3* 26.9* 26.6*  MCV 102.3* 104.0* 106.9* 112.1* 114.2*  PLT 205 158 161 184 221   Basic Metabolic Panel: Recent Labs  Lab 10/29/22 2116 10/30/22 0623 11/01/22 0339 11/02/22 0051 11/03/22 0207 11/04/22 0351 11/05/22 0421  NA  --    < > 132* 131* 131* 130* 131*  K  --    < > 3.1* 3.8 3.2* 4.0 3.6  CL  --    < > 96* 97* 95* 97* 98  CO2  --    < > 25 26 27 23 25   GLUCOSE  --    < > 90 88 92 81 79  BUN  --    < > <5* 5* 5* 8 9  CREATININE  --    < > 0.62 0.61 0.65 0.58 0.60  CALCIUM  --    < > 8.5* 8.1* 8.1* 7.8* 8.0*  MG 2.0  --  1.8 1.9  --  2.3  --    < > = values in this interval not displayed.   GFR: Estimated  Creatinine Clearance: 103.3 mL/min (by C-G formula based on SCr of 0.6 mg/dL). Liver Function Tests: Recent Labs  Lab 11/01/22 0339 11/02/22 0051 11/03/22 0207 11/04/22 0351 11/05/22 0421  AST 445* 357* 309* 255* 214*  ALT 110* 98* 95* 80* 73*  ALKPHOS 295* 261* 262* 234* 218*  BILITOT 24.2* 24.9* 28.3* 28.1* 27.9*  PROT 5.0* 4.7* 5.1* 4.9* 4.8*  ALBUMIN 1.8* 1.7* 1.8* 1.7* 1.7*   Recent Labs  Lab 10/29/22 1931  LIPASE 74*   Recent Labs  Lab 10/30/22 0623  AMMONIA 68*  Coagulation Profile: Recent Labs  Lab 10/29/22 2116 10/31/22 0116 11/01/22 0339 11/03/22 0207 11/04/22 0351  INR 1.4* 1.6* 1.8* 1.4* 1.3*   Recent Results (from the past 240 hour(s))  Culture, body fluid w Gram Stain-bottle     Status: None   Collection Time: 10/30/22  1:41 PM   Specimen: Peritoneal Washings  Result Value Ref Range Status   Specimen Description PERITONEAL  Final   Special Requests NONE  Final   Culture   Final    NO GROWTH 5 DAYS Performed at [facility], [address] [place], [place] [address]    Report Status 11/04/2022 FINAL  Final  Gram stain     Status: None  Collection Time: 10/30/22  1:41 PM   Specimen: Peritoneal Washings  Result Value Ref Range Status   Specimen Description PERITONEAL  Final   Special Requests NONE  Final   Gram Stain   Final    WBC PRESENT, PREDOMINANTLY MONONUCLEAR NO ORGANISMS SEEN CYTOSPIN SMEAR Performed at [facility], [address] [place], [place] [address]    Report Status 10/30/2022 FINAL  Final    Antimicrobials: Anti-infectives (From admission, onward)    None      Culture/Microbiology    Component Value Date/Time   SDES PERITONEAL 10/30/2022 1341   SDES PERITONEAL 10/30/2022 1341   SPECREQUEST NONE 10/30/2022 1341   SPECREQUEST NONE 10/30/2022 1341   CULT  10/30/2022 1341    NO GROWTH 5 DAYS Performed at [facility], [address], [place], [place] [address]    REPTSTATUS 11/04/2022 FINAL  10/30/2022 1341   REPTSTATUS 10/30/2022 FINAL 10/30/2022 1341  Radiology Studies: No results found.   LOS: 6 days   Lanae Boast, MD [facility]  11/05/2022, 9:58 AM

## 2022-11-05 NOTE — Progress Notes (Signed)
PT Cancellation Note  Patient Details Name: Tanya Goodman MRN: [id number] DOB: Jul 04, 1985   Cancelled Treatment:    Reason Eval/Treat Not Completed: PT screened, no needs identified, will sign off. Pt reports steady improvement in mobility since PT eval and discharge on 8/3. Pt reports ambulating in the room and hallways independently. Pt does not appear to have need for acute PT evaluation at this time. PT signing off.   Arlyss Gandy 11/05/2022, 11:07 AM

## 2022-11-05 NOTE — Plan of Care (Signed)
  Problem: Nutrition: Goal: Adequate nutrition will be maintained Outcome: Progressing   Problem: Education: Goal: Knowledge of General Education information will improve Description: Including pain rating scale, medication(s)/side effects and non-pharmacologic comfort measures Outcome: Progressing   Problem: Health Behavior/Discharge Planning: Goal: Ability to manage health-related needs will improve Outcome: Progressing   Problem: Clinical Measurements: Goal: Ability to maintain clinical measurements within normal limits will improve Outcome: Progressing   Problem: Activity: Goal: Risk for activity intolerance will decrease Outcome: Progressing   Problem: Nutrition: Goal: Adequate nutrition will be maintained Outcome: Progressing

## 2022-11-05 NOTE — Progress Notes (Signed)
Occupational Therapy Discharge Patient Details Name: Tanya Goodman MRN: [id number] DOB: 05-05-1985 Today's Date: 11/05/2022 Time:  -     Patient discharged from OT services secondary to goals met and no further OT needs identified.  Please see latest therapy progress note for current level of functioning and progress toward goals.    Progress and discharge plan discussed with patient and/or caregiver: Patient/Caregiver agrees with plan  GO     [name] 11/05/2022, 12:55 PM

## 2022-11-06 DIAGNOSIS — K7011 Alcoholic hepatitis with ascites: Secondary | ICD-10-CM | POA: Diagnosis not present

## 2022-11-06 DIAGNOSIS — E876 Hypokalemia: Secondary | ICD-10-CM | POA: Diagnosis not present

## 2022-11-06 DIAGNOSIS — D649 Anemia, unspecified: Secondary | ICD-10-CM | POA: Diagnosis not present

## 2022-11-06 MED ORDER — POTASSIUM CHLORIDE CRYS ER 20 MEQ PO TBCR
40.0000 meq | EXTENDED_RELEASE_TABLET | Freq: Once | ORAL | Status: AC
  Administered 2022-11-06: 40 meq via ORAL
  Filled 2022-11-06: qty 2

## 2022-11-06 MED ORDER — ENSURE ENLIVE PO LIQD
237.0000 mL | ORAL | Status: DC
  Administered 2022-11-06: 237 mL via ORAL

## 2022-11-06 NOTE — Progress Notes (Signed)
PROGRESS NOTE Tanya Goodman  PPI:[id number] DOB: Nov 05, 1985 DOA: 10/29/2022 PCP: Patient, No Pcp Per  Brief Narrative/Hospital Course: 37 y.o. female with history of alcohol use disorder presented to hospital with abdominal pain yellowish discoloration of her skin with lower extremity edema.   Patient drinks vodka 1/5 a day or a couple of bottles of wine.  She went through detox a few months ago and did well for almost a month, went through a break up and moving and that triggered her.    Started drinking again about 2 months ago.  For the last  month she has also noticed increased abdominal girth, nausea vomiting.  In the ED patient was noted to have bilirubin of 24, AST 726, ALT 169 with sodium of 128.  INR elevated at 1.6.  CT scan of the abdomen pelvis showed gallbladder wall thickening with severe hepatic steatosis with some mild ascites and anasarca.  She was admitted w/ working diagnosis of acute on chronic liver failure, acute alcoholic hepatitis.GI on board> placed on medical therapy including systemic corticosteroids.Patient clinically improving. AST and ALT trending down. Bilirubin slowly improving as well.  11/05/22; AST/ALT/TB at 214/73/27.9 from  peak of 726/169/28 on admission. INR stable.?  Colitis on CT scan but asymptomatic.    Subjective:  Patient seen and examined this morning  Appears icteric  Labs with hypokalemia liver enzymes although improving total bili remains elevated    Assessment and Plan: Principal Problem:   Alcoholic hepatitis Active Problems:   Hypokalemia   Anemia   Alcohol dependence (HCC)   Class 1 obesity  Alcohol abuse Suspected alcohol hepatitis with markedly elevated liver enzymes Ascites: Discrimination function of 52 on admission, s/p diagnostic paracentesis, negative for SBP, GI following closely. ANA positive,  but remainder of other studies ordered per GI were negative, extensive serology workup per GI. Per GI-patient did not respond to  treatment for alcoholic hepatitis, Lilly score suggested futile to continue prednisolone, per Koleen Nimrod may get better but not a good sign, medically okay to discharge but would need emotional support to keep him 1 more day in the hospital, patient would like to continue on prednisolone GI advised to keep for 20 days course and they will revisit to taper and retest the lab next follow-up arranged on 8/14.  Social worker consulted to help with Medicaid-she could need a liver transplant at some point Continue Lasix, Aldactone and lactulose Recent Labs  Lab 10/31/22 0116 11/01/22 0339 11/02/22 0051 11/03/22 0207 11/04/22 0351 11/05/22 0421 11/06/22 0134  AST 555* 445* 357* 309* 255* 214* 190*  ALT 134* 110* 98* 95* 80* 73* 73*  ALKPHOS 288* 295* 261* 262* 234* 218* 194*  BILITOT 24.9* 24.2* 24.9* 28.3* 28.1* 27.9* 28.0*  PROT 5.4* 5.0* 4.7* 5.1* 4.9* 4.8* 4.9*  ALBUMIN 1.9* 1.8* 1.7* 1.8* 1.7* 1.7* 1.8*  INR 1.6* 1.8*  --  1.4* 1.3*  --  1.3*  PLT 161 184 221  --   --   --  188      Latest Ref Rng & Units 10/29/2022    9:16 PM  Hepatitis  Hep B Surface Ag NON REACTIVE NON REACTIVE   Hep B IgM NON REACTIVE NON REACTIVE   Hep C Ab NON REACTIVE NON REACTIVE   Hep A IgM NON REACTIVE NON REACTIVE    MELD 3.0: 28 at 11/06/2022  1:34 AM MELD-Na: 27 at 11/06/2022  1:34 AM Calculated from: Serum Creatinine: 0.65 mg/dL (Using min of 1 mg/dL) at 0/08/3014  0:10  AM Serum Sodium: 128 mmol/L at 11/06/2022  1:34 AM Total Bilirubin: 28.0 mg/dL at 0/11/8117  1:47 AM Serum Albumin: 1.8 g/dL at 10/31/9560  1:30 AM INR(ratio): 1.3 at 11/06/2022  1:34 AM Age at listing (hypothetical): 37 years Sex: Female at 11/06/2022  1:34 AM    Hypokalemia Hyponatremia: Replacing  Macrocytic anemia: Suspect in the setting of alcoholic liver disease/alcohol abuse,anemia panel showed B12 folate on higher side, serum iron 116 with ferritin 2498,trend hb. Recent Labs  Lab 10/31/22 0116 11/01/22 0339 11/02/22 0051  11/06/22 0134  HGB 9.3* 8.9* 9.0* 9.1*  HCT 26.3* 26.9* 26.6* 27.5*    Alcohol dependence Tobacco abuse:  No signs of withdrawal,no longer on CIWA protocol, advised smoking cessation and alcohol cessation.    Class I Obesity:Patient's Body mass index is 32.62 kg/m. : Will benefit with PCP follow-up, weight loss  healthy lifestyle and outpatient sleep evaluation.  DVT prophylaxis: SCDs Start: 10/30/22 0953 Code Status:   Code Status: Full Code Family Communication: plan of care discussed with patient/ at bedside. Patient status is:  inpatient because of liver dysfunction Level of care: Med-Surg   Dispo: The patient is from: home            Anticipated disposition: home  tomorrow  Objective: Vitals last 24 hrs: Vitals:   11/05/22 1632 11/05/22 2149 11/06/22 0633 11/06/22 0849  BP: 114/84 118/80 (!) 100/57 114/77  Pulse: 96 99 91 (!) 108  Resp: 18 18 18 18   Temp: 98.6 F (37 C) 98.3 F (36.8 C) 98 F (36.7 C) 98.4 F (36.9 C)  TempSrc:   Oral Oral  SpO2: 97% 94% 95% 96%  Weight:      Height:       Weight change:   Physical Examination: General exam: alert awake, oriented at baseline, older than stated age HEENT:Oral mucosa moist, Ear/Nose WNL grossly Respiratory system: Bilaterally clear BS,no use of accessory muscle Cardiovascular system: S1 & S2 +, No JVD. Gastrointestinal system: Abdomen soft, full NT,BS+ Nervous System: Alert, awake, moving all extremities,and following commands. Extremities: LE edema neg,distal peripheral pulses palpable and warm.  Skin: No rashes,++ icterus. MSK: Normal muscle bulk,tone, power   Medications reviewed:  Scheduled Meds:  folic acid  1 mg Oral Daily   furosemide  40 mg Oral Daily   magnesium oxide  400 mg Oral BID   multivitamin with minerals  1 tablet Oral Daily   nicotine  21 mg Transdermal QHS   pantoprazole  40 mg Oral Daily   prednisoLONE  40 mg Oral Daily   spironolactone  100 mg Oral Daily   thiamine  100 mg Oral  Daily   Continuous Infusions:    Diet Order             Diet Heart Fluid consistency: Thin  Diet effective now                    Nutrition Problem: Altered GI function Etiology: vomiting, nausea, constipation, diarrhea Signs/Symptoms: per patient/family report Interventions: Refer to RD note for recommendations, MVI, Education   Intake/Output Summary (Last 24 hours) at 11/06/2022 1125 Last data filed at 11/06/2022 0900 Gross per 24 hour  Intake 720 ml  Output 0 ml  Net 720 ml   Net IO Since Admission: 7,520 mL [11/06/22 1125]  Wt Readings from Last 3 Encounters:  10/29/22 86.2 kg  09/16/17 86.2 kg     Unresulted Labs (From admission, onward)     Start  Ordered   11/06/22 0500  CBC with Differential/Platelet  Daily,   R     Question:  Specimen collection method  Answer:  Lab=Lab collect   11/05/22 1151   10/31/22 1556  Hemochromatosis DNA-PCR(c282y,h63d)  Add-on,   AD        10/31/22 1555          Data Reviewed: I have personally reviewed following labs and imaging studies CBC: Recent Labs  Lab 10/31/22 0116 11/01/22 0339 11/02/22 0051 11/06/22 0134  WBC 4.3 6.9 7.6 6.2  NEUTROABS  --   --   --  4.0  HGB 9.3* 8.9* 9.0* 9.1*  HCT 26.3* 26.9* 26.6* 27.5*  MCV 106.9* 112.1* 114.2* 116.5*  PLT 161 184 221 188   Basic Metabolic Panel: Recent Labs  Lab 11/01/22 0339 11/02/22 0051 11/03/22 0207 11/04/22 0351 11/05/22 0421 11/06/22 0134  NA 132* 131* 131* 130* 131* 128*  K 3.1* 3.8 3.2* 4.0 3.6 3.1*  CL 96* 97* 95* 97* 98 95*  CO2 25 26 27 23 25 25   GLUCOSE 90 88 92 81 79 96  BUN <5* 5* 5* 8 9 8   CREATININE 0.62 0.61 0.65 0.58 0.60 0.65  CALCIUM 8.5* 8.1* 8.1* 7.8* 8.0* 8.0*  MG 1.8 1.9  --  2.3  --   --    GFR: Estimated Creatinine Clearance: 103.3 mL/min (by C-G formula based on SCr of 0.65 mg/dL). Liver Function Tests: Recent Labs  Lab 11/02/22 0051 11/03/22 0207 11/04/22 0351 11/05/22 0421 11/06/22 0134  AST 357* 309* 255* 214*  190*  ALT 98* 95* 80* 73* 73*  ALKPHOS 261* 262* 234* 218* 194*  BILITOT 24.9* 28.3* 28.1* 27.9* 28.0*  PROT 4.7* 5.1* 4.9* 4.8* 4.9*  ALBUMIN 1.7* 1.8* 1.7* 1.7* 1.8*   No results for input(s): "LIPASE", "AMYLASE" in the last 168 hours.  No results for input(s): "AMMONIA" in the last 168 hours. Coagulation Profile: Recent Labs  Lab 10/31/22 0116 11/01/22 0339 11/03/22 0207 11/04/22 0351 11/06/22 0134  INR 1.6* 1.8* 1.4* 1.3* 1.3*   Recent Results (from the past 240 hour(s))  Culture, body fluid w Gram Stain-bottle     Status: None   Collection Time: 10/30/22  1:41 PM   Specimen: Peritoneal Washings  Result Value Ref Range Status   Specimen Description PERITONEAL  Final   Special Requests NONE  Final   Culture   Final    NO GROWTH 5 DAYS Performed at [facility], [address], [place], [place] [address]    Report Status 11/04/2022 FINAL  Final  Gram stain     Status: None   Collection Time: 10/30/22  1:41 PM   Specimen: Peritoneal Washings  Result Value Ref Range Status   Specimen Description PERITONEAL  Final   Special Requests NONE  Final   Gram Stain   Final    WBC PRESENT, PREDOMINANTLY MONONUCLEAR NO ORGANISMS SEEN CYTOSPIN SMEAR Performed at [facility], [address], [place], [place] [address]    Report Status 10/30/2022 FINAL  Final    Antimicrobials: Anti-infectives (From admission, onward)    None      Culture/Microbiology    Component Value Date/Time   SDES PERITONEAL 10/30/2022 1341   SDES PERITONEAL 10/30/2022 1341   SPECREQUEST NONE 10/30/2022 1341   SPECREQUEST NONE 10/30/2022 1341   CULT  10/30/2022 1341    NO GROWTH 5 DAYS Performed at [facility], [address], [place], [place] [address]  REPTSTATUS 11/04/2022 FINAL 10/30/2022 1341   REPTSTATUS 10/30/2022 FINAL 10/30/2022 1341  Radiology Studies: No results found.   LOS: 7 days   Lanae Boast, MD [facility]  11/06/2022, 11:25 AM

## 2022-11-06 NOTE — TOC Progression Note (Addendum)
Transition of Care [facility]) - Progression Note    Patient Details  Name: Tanya Goodman MRN: [id number] Date of Birth: 01/08/1986  Transition of Care [facility]) CM/SW Contact  Tom-Johnson, Hershal Coria, RN Phone Number: 11/06/2022, 12:29 PM  Clinical Narrative:     Patient referred to Financial Navigator for Medicaid eligibility and has been referred over to the [facility] team to be screened.   CM will continue to follow.        Expected Discharge Plan and Services                                               Social Determinants of Health (SDOH) Interventions SDOH Screenings   Food Insecurity: Food Insecurity Present (11/02/2022)  Housing: Medium Risk (10/30/2022)  Transportation Needs: No Transportation Needs (10/30/2022)  Utilities: At Risk (10/30/2022)  Depression (PHQ2-9): High Risk (07/13/2022)  Tobacco Use: High Risk (10/29/2022)    Readmission Risk Interventions    10/30/2022   11:10 AM  Readmission Risk Prevention Plan  Post Dischage Appt Complete  Medication Screening Complete  Transportation Screening Complete

## 2022-11-06 NOTE — Progress Notes (Addendum)
Daily Progress Note  DOA: 10/29/2022 Hospital Day: 9 Chief Complaint: Abnormal liver chemistries   ASSESSMENT & PLAN   Brief Narrative:  Tanya Goodman is a 37 y.o. year old female with a history of   Etoh abuse admitted 7/31 with abdominal pain and jaundice.   Etoh abuse / suspected Etoh hepatitis with markedly elevated liver chemistries / ascites. Discriminant function of 52 on admission.  - s/p diagnostic paracentesis -Ascitic fluid negative for SBP.  -Continue lasix  40 mg / aldactone 100 mg daily for now but monitor closely for ongoing need for diuretics. Has bilateral leg wraps in place for swelling but only mild ascites on imaging. Edema may be due in part to severe hypoalbuminemia? .  -On heart healthy diet -Some improvement in INR post Vitamin K . - Ammonia 68 on admission. No encephalopathy. Lactulose stopped due to excessive BMs and no clinical enscephalopathy - ANA positive, remainder of other autoimmune markers negative.  Added ceruloplasmin and alpha 1 anti-trypsinogen yesterday and both negative  Ferritin 2498, hemochromatosis DNA - PCR is pending   -Started Prednisolone on 7/31. Will calculate Lille score on 8/7. Still markedly cholestatic. Tbili 27.8, essentially unchanged . Liver enzymes continue to improve . Normal mentation.    Lille score 0.831 - non-responder, poor prognosis dc prednisolone suggested she wishes to continue so have agreed to do so - we had a frank conversation about uncertain prognosis due to lack of response at 1 week  She can dc soon - would Rx prednisolone to have enough to complete 28 d course and we can reassess when she sees [place] 8/14  Maintain EtOH abstinence   Hyponatremia.     Macrocytic anemia with folate deficiency.  May have some bone marrow suppression from Etoh.  -folic acid repletion in progress  - she does not need PPI at dc - I will stop  - call us back if needed - per communication w/ Dr. Jonathon Bellows anticipate dc  tomorrow  Iva Boop, MD, [facility] See Loretha Stapler on call - gastroenterology for best contact person 11/06/2022 11:30 AM  ---------------------------------------------------------------------------------------------------   Subjective   Slightly more swollen at flanks Moving about well overall PT/OT stopped due to level of fx and activity    Objective   BMET Recent Labs    11/04/22 0351 11/05/22 0421 11/06/22 0134  NA 130* 131* 128*  K 4.0 3.6 3.1*  CL 97* 98 95*  CO2 23 25 25   GLUCOSE 81 79 96  BUN 8 9 8   CREATININE 0.58 0.60 0.65  CALCIUM 7.8* 8.0* 8.0*   LFT Recent Labs    11/06/22 0134  PROT 4.9*  ALBUMIN 1.8*  AST 190*  ALT 73*  ALKPHOS 194*  BILITOT 28.0*   PT/INR Recent Labs    11/04/22 0351 11/06/22 0134  LABPROT 16.2* 15.9*  INR 1.3* 1.3*       Scheduled inpatient medications:   folic acid  1 mg Oral Daily   furosemide  40 mg Oral Daily   magnesium oxide  400 mg Oral BID   multivitamin with minerals  1 tablet Oral Daily   nicotine  21 mg Transdermal QHS   pantoprazole  40 mg Oral Daily   prednisoLONE  40 mg Oral Daily   spironolactone  100 mg Oral Daily   thiamine  100 mg Oral Daily   Continuous inpatient infusions:  PRN inpatient medications: bisacodyl, LORazepam, ondansetron **OR** ondansetron (ZOFRAN) IV, oxyCODONE, polyethylene glycol  Vital  signs in last 24 hours: Temp:  [98 F (36.7 C)-98.6 F (37 C)] 98.4 F (36.9 C) (08/07 0849) Pulse Rate:  [91-108] 108 (08/07 0849) Resp:  [18] 18 (08/07 0849) BP: (100-118)/(57-84) 114/77 (08/07 0849) SpO2:  [94 %-97 %] 96 % (08/07 0849) Last BM Date : 11/06/22  Intake/Output Summary (Last 24 hours) at 11/06/2022 1057 Last data filed at 11/06/2022 0900 Gross per 24 hour  Intake 720 ml  Output 0 ml  Net 720 ml    Intake/Output from previous day: 08/06 0701 - 08/07 0700 In: 720 [P.O.:720] Out: 0  Intake/Output this shift: Total I/O In: 240 [P.O.:240] Out: -     Physical Exam:  General: Alert female in NAD Heart:  Regular rate and rhythm.  Abdomen: Soft, mildly protuberant/obese, nontender. Normal bowel sounds. Extremities: Bilateral lower extremity leg wraps Neurologic: Alert and oriented. No asterixis Psych: Pleasant. Cooperative. Insight appears normal. Was tearful after prognosis discussion      LOS: 7 days

## 2022-11-06 NOTE — Progress Notes (Signed)
Initial Nutrition Assessment  DOCUMENTATION CODES:   Not applicable  INTERVENTION:   Education on low Na, high protein diet   Ensure Plus High Protein po daily, each supplement provides 350 kcal and 20 grams of protein.  Continue MVI    NUTRITION DIAGNOSIS:   Altered GI function related to vomiting, nausea, constipation, diarrhea as evidenced by per patient/family report.  GOAL:   Patient will meet greater than or equal to 90% of their needs   MONITOR:   PO intake, Skin, I & O's, Labs, Weight trends  REASON FOR ASSESSMENT:   Consult Assessment of nutrition requirement/status  ASSESSMENT:   37 y.o. female with hx of EtOH abuse presents with abdominal pain, increasing abdominal girth and jaundice. Patient admitted with alcoholic hepatitis.  Visited patient at bedside who was eating lunch-- chicken breast and rice. She reports a much improved appetite and tolerating her diet.   No N/V/D/C reported.   RD discussed education handouts with patient who is agreeable to trying Ensure Plus High Protein as her high protein bedtime snack while in-patient.   Labs: Na 128, K+ 3.1, alk phos 194, AST 190, ALT 73 Meds: Ensure BID, folvite, lasix, mag-ox, MVI, nicoderm, prednisolone, aldactone, thiamine   PO: 100% at all meals  I/O's: +7 L   Diet Order:   Diet Order             Diet Heart Fluid consistency: Thin  Diet effective now                   EDUCATION NEEDS:   Education needs have been addressed  Skin:  Skin Assessment: Reviewed RN Assessment  Last BM:  8/7  Height:   Ht Readings from Last 1 Encounters:  10/29/22 5\' 4"  (1.626 m)    Weight:   Wt Readings from Last 1 Encounters:  10/29/22 86.2 kg    Ideal Body Weight:     BMI:  Body mass index is 32.62 kg/m.  Estimated Nutritional Needs:   Kcal:  1700-1900  Protein:  90-105 g  Fluid:  >/= 1.9 L    Leodis Rains, RDN, LDN  Clinical Nutrition

## 2022-11-07 ENCOUNTER — Other Ambulatory Visit (HOSPITAL_COMMUNITY): Payer: Self-pay

## 2022-11-07 DIAGNOSIS — K7011 Alcoholic hepatitis with ascites: Secondary | ICD-10-CM | POA: Diagnosis not present

## 2022-11-07 MED ORDER — FOLIC ACID 1 MG PO TABS
1.0000 mg | ORAL_TABLET | Freq: Every day | ORAL | 0 refills | Status: DC
  Filled 2022-11-07: qty 30, 30d supply, fill #0

## 2022-11-07 MED ORDER — THIAMINE HCL 100 MG PO TABS
100.0000 mg | ORAL_TABLET | Freq: Every day | ORAL | 0 refills | Status: DC
  Filled 2022-11-07: qty 30, 30d supply, fill #0

## 2022-11-07 MED ORDER — FUROSEMIDE 40 MG PO TABS
40.0000 mg | ORAL_TABLET | Freq: Every day | ORAL | 30 refills | Status: DC
  Filled 2022-11-07: qty 30, 30d supply, fill #0
  Filled 2022-12-04: qty 30, 30d supply, fill #1

## 2022-11-07 MED ORDER — ADULT MULTIVITAMIN W/MINERALS CH
1.0000 | ORAL_TABLET | Freq: Every day | ORAL | 0 refills | Status: DC
Start: 2022-11-07 — End: 2023-03-11
  Filled 2022-11-07: qty 130, 130d supply, fill #0

## 2022-11-07 MED ORDER — HYDROXYZINE HCL 25 MG PO TABS
25.0000 mg | ORAL_TABLET | Freq: Three times a day (TID) | ORAL | 0 refills | Status: AC | PRN
  Filled 2022-11-07: qty 90, 30d supply, fill #0

## 2022-11-07 MED ORDER — PREDNISONE 5 MG PO TABS
40.0000 mg | ORAL_TABLET | Freq: Every day | ORAL | 0 refills | Status: AC
Start: 2022-11-07 — End: 2022-11-27
  Filled 2022-11-07: qty 160, 20d supply, fill #0

## 2022-11-07 MED ORDER — SPIRONOLACTONE 100 MG PO TABS
100.0000 mg | ORAL_TABLET | Freq: Every day | ORAL | 0 refills | Status: DC
  Filled 2022-11-07: qty 30, 30d supply, fill #0

## 2022-11-07 NOTE — Progress Notes (Signed)
Wraps removed from [facility] upon discharge and compression wraps provided and educated on with AVS and TOC medication provided to patient.

## 2022-11-07 NOTE — Progress Notes (Signed)
[employer] C[employer] Pharmacies please call [phone] for claim processing assistance.  Rx BIN: [id number] Rx Group: [id number] Rx PCN: [id number] Relationship Code: 1 Person Code: 01  Patient ID (MRN): [id number]      Patient Name: Tanya Goodman     Patient DOB: 1985/05/31   Discharge Date: 11/07/22  Expiration Date: 11/15/22 (must be filled within 7 days of discharge)

## 2022-11-07 NOTE — Discharge Summary (Addendum)
Physician Discharge Summary  Tanya Goodman JYN:[id number] DOB: Feb 08, 1986 DOA: 10/29/2022  PCP: Patient, No Pcp Per  Admit date: 10/29/2022 Discharge date: 11/07/2022 Recommendations for Outpatient Follow-up:  Follow up with PCP and [facility]  in 1 weeks-call for appointment Please obtain BMP/CBC in one week  Discharge Dispo: Home Discharge Condition: Stable Code Status:   Code Status: Full Code Diet recommendation:  Diet Order             Diet Heart Fluid consistency: Thin  Diet effective now                    Brief/Interim Summary: 37 y.o. female with history of alcohol use disorder presented to hospital with abdominal pain yellowish discoloration of her skin with lower extremity edema.   Patient drinks vodka 1/5 a day or a couple of bottles of wine.  She went through detox a few months ago and did well for almost a month, went through a break up and moving and that triggered her.    Started drinking again about 2 months ago.  For the last  month she has also noticed increased abdominal girth, nausea vomiting.  In the ED patient was noted to have bilirubin of 24, AST 726, ALT 169 with sodium of 128.  INR elevated at 1.6.  CT scan of the abdomen pelvis showed gallbladder wall thickening with severe hepatic steatosis with some mild ascites and anasarca.  She was admitted w/ working diagnosis of acute on chronic liver failure, acute alcoholic hepatitis.GI on board> placed on medical therapy including systemic corticosteroids.Patient clinically improving. AST and ALT trending down. Bilirubin slowly improving as well.  11/05/22; AST/ALT/TB at 214/73/27.9 from  peak of 726/169/28 on admission. INR stable.?  Colitis on CT scan but asymptomatic. Per GI-patient did not respond to treatment for alcoholic hepatitis, Lilly score suggested futile to continue prednisolone, per Koleen Nimrod may get better but not a good sign, medically okay to discharge but would need emotional support to keep him 1 more  day in the hospital, patient would like to continue on prednisolone GI advised to keep for 20 days course and they will revisit to taper and retest the lab next follow-up arranged on 8/14     Discharge Diagnoses:  Principal Problem:   Alcoholic hepatitis Active Problems:   Hypokalemia   Anemia   Alcohol dependence (HCC)   Class 1 obesity Alcohol abuse Suspected alcohol hepatitis with markedly elevated liver enzymes Ascites: Discrimination function of 52 on admission, s/p diagnostic paracentesis, negative for SBP, GI following closely. ANA positive,  but remainder of other studies ordered per GI were negative, extensive serology workup per GI. Per GI-patient did not respond to treatment for alcoholic hepatitis, Lilly score suggested futile to continue prednisolone, per Koleen Nimrod may get better but not a good sign, medically okay to discharge but would need emotional support to keep him 1 more day in the hospital, patient would like to continue on prednisolone GI advised to keep for 20 days course and they will revisit to taper and retest the lab next follow-up arranged on 8/14.  Social worker consulted to help with Medicaid-she could need a liver transplant at some point Continue Lasix, Aldactone and lactulose-follow-up outpatient, overall stable at this time for dc home. Pharmacy does not carry prednisolone Dr. Leone Payor okay with switching to prednisone Recent Labs  Lab 11/01/22 0339 11/02/22 0051 11/03/22 0207 11/04/22 0351 11/05/22 0421 11/06/22 0134 11/07/22 0219  AST 445* 357* 309* 255* 214* 190*  --  ALT 110* 98* 95* 80* 73* 73*  --   ALKPHOS 295* 261* 262* 234* 218* 194*  --   BILITOT 24.2* 24.9* 28.3* 28.1* 27.9* 28.0*  --   PROT 5.0* 4.7* 5.1* 4.9* 4.8* 4.9*  --   ALBUMIN 1.8* 1.7* 1.8* 1.7* 1.7* 1.8*  --   INR 1.8*  --  1.4* 1.3*  --  1.3*  --   PLT 184 221  --   --   --  188 214      Latest Ref Rng & Units 10/29/2022    9:16 PM  Hepatitis  Hep B Surface Ag NON REACTIVE  NON REACTIVE   Hep B IgM NON REACTIVE NON REACTIVE   Hep C Ab NON REACTIVE NON REACTIVE   Hep A IgM NON REACTIVE NON REACTIVE    MELD 3.0: 28 at 11/06/2022  1:34 AM MELD-Na: 27 at 11/06/2022  1:34 AM Calculated from: Serum Creatinine: 0.65 mg/dL (Using min of 1 mg/dL) at 0/0/7622  6:33 AM Serum Sodium: 128 mmol/L at 11/06/2022  1:34 AM Total Bilirubin: 28.0 mg/dL at 06/03/4560  5:63 AM Serum Albumin: 1.8 g/dL at 11/07/3732  2:87 AM INR(ratio): 1.3 at 11/06/2022  1:34 AM Age at listing (hypothetical): 37 years Sex: Female at 11/06/2022  1:34 AM    Hypokalemia Hyponatremia: Replaced  Macrocytic anemia: Suspect in the setting of alcoholic liver disease/alcohol abuse,anemia panel showed B12 folate on higher side, serum iron 116 with ferritin 2498,trend hb.  Hemoglobin has been stable Recent Labs  Lab 11/01/22 0339 11/02/22 0051 11/06/22 0134 11/07/22 0219  HGB 8.9* 9.0* 9.1* 9.1*  HCT 26.9* 26.6* 27.5* 28.0*    Alcohol dependence Tobacco abuse:  No signs of withdrawal,no longer on CIWA protocol, advised smoking cessation and alcohol cessation.    Class I Obesity:Patient's Body mass index is 32.62 kg/m. : Will benefit with PCP follow-up, weight loss  healthy lifestyle and outpatient sleep evaluation.  Consults: Gastroenterology Subjective: Aaox3, no complaints Doing well Belly full No chills or abdomen pain  Icteric still  Discharge Exam: Vitals:   11/06/22 2116 11/07/22 0511  BP: 121/88 (!) 102/56  Pulse: 96 96  Resp: 16 16  Temp: 98.6 F (37 C) 99.2 F (37.3 C)  SpO2: 96% 96%   General: Pt is alert, awake, not in acute distress Cardiovascular: RRR, S1/S2 +, no rubs, no gallops Respiratory: CTA bilaterally, no wheezing, no rhonchi Abdominal: Soft, NT, ND, bowel sounds + Extremities: no edema, no cyanosis  Discharge Instructions  Discharge Instructions     Discharge instructions   Complete by: As directed    Please avoid alcohol at any cost or you will risk having  acute liver failure and worsening of her condition  Please follow-up with gastroenterology as advised before stopping her medication and to wean down slowly and monitor liver function test  Please call call MD or return to ER for similar or worsening recurring problem that brought you to hospital or if any fever,nausea/vomiting,abdominal pain, uncontrolled pain, chest pain,  shortness of breath or any other alarming symptoms.  Please follow-up your doctor as instructed in a week time and call the office for appointment.  Please avoid alcohol, smoking, or any other illicit substance and maintain healthy habits including taking your regular medications as prescribed.  You were cared for by a hospitalist during your hospital stay. If you have any questions about your discharge medications or the care you received while you were in the hospital after you are discharged, you can  call the unit and ask to speak with the hospitalist on call if the hospitalist that took care of you is not available.  Once you are discharged, your primary care physician will handle any further medical issues. Please note that NO REFILLS for any discharge medications will be authorized once you are discharged, as it is imperative that you return to your primary care physician (or establish a relationship with a primary care physician if you do not have one) for your aftercare needs so that they can reassess your need for medications and monitor your lab values   Increase activity slowly   Complete by: As directed    No wound care   Complete by: As directed       Allergies as of 11/07/2022   No Known Allergies      Medication List     STOP taking these medications    gabapentin 300 MG capsule Commonly known as: NEURONTIN       TAKE these medications    folic acid 1 MG tablet Commonly known as: FOLVITE Take 1 tablet (1 mg total) by mouth daily.   furosemide 40 MG tablet Commonly known as: LASIX Take 1  tablet (40 mg total) by mouth daily.   hydrOXYzine 25 MG tablet Commonly known as: ATARAX Take 1 tablet (25 mg total) by mouth 3 (three) times daily as needed for anxiety.   melatonin 5 MG Tabs Take 1 tablet (5 mg total) by mouth at bedtime.   multivitamin with minerals Tabs tablet Take 1 tablet by mouth daily.   nicotine 14 mg/24hr patch Commonly known as: NICODERM CQ - dosed in mg/24 hours Place 1 patch (14 mg total) onto the skin daily as needed (smoking cessation).   prednisoLONE 5 MG Tabs tablet Take 8 tablets (40 mg total) by mouth daily for 20 days.   spironolactone 100 MG tablet Commonly known as: ALDACTONE Take 1 tablet (100 mg total) by mouth daily.   thiamine 100 MG tablet Commonly known as: Vitamin B-1 Take 1 tablet (100 mg total) by mouth daily.        Follow-up Information     Iva Boop, MD Follow up on 11/13/2022.   Specialty: Gastroenterology Why: Follow-up on 8/14 please call the office to confirm the time Contact information: [address] [place] [address] [phone]         [facility] Follow up in 1 week(s).   Contact information: [address] [place] [address] [phone]               No Known Allergies  The results of significant diagnostics from this hospitalization (including imaging, microbiology, ancillary and laboratory) are listed below for reference.    Microbiology: Recent Results (from the past 240 hour(s))  Culture, body fluid w Gram Stain-bottle     Status: None   Collection Time: 10/30/22  1:41 PM   Specimen: Peritoneal Washings  Result Value Ref Range Status   Specimen Description PERITONEAL  Final   Special Requests NONE  Final   Culture   Final    NO GROWTH 5 DAYS Performed at [facility], [address] [place], [place] [address]    Report Status 11/04/2022 FINAL  Final  Gram stain     Status: None   Collection  Time: 10/30/22  1:41 PM   Specimen: Peritoneal Washings  Result Value Ref Range Status   Specimen Description PERITONEAL  Final   Special Requests NONE  Final   Gram Stain   Final    WBC PRESENT, PREDOMINANTLY MONONUCLEAR NO ORGANISMS SEEN CYTOSPIN SMEAR Performed at [facility], [address] [place], [place] [address]    Report Status 10/30/2022 FINAL  Final    Procedures/Studies: IR Paracentesis  Result Date: 10/30/2022 INDICATION: Alcohol abuse admitted for abdominal swelling and jaundice. Imaging shows small volume ascites. Interventional radiology asked to perform a diagnostic and therapeutic paracentesis. EXAM: ULTRASOUND GUIDED PARACENTESIS MEDICATIONS: 1% lidocaine 15 mL COMPLICATIONS: None immediate. PROCEDURE: Informed written consent was obtained from the patient after a discussion of the risks, benefits and alternatives to treatment. A timeout was performed prior to the initiation of the procedure. Initial ultrasound scanning demonstrates a small amount of ascites within the right lower abdominal quadrant. The right lower abdomen was prepped and draped in the usual sterile fashion. 1% lidocaine was used for local anesthesia. Following this, a 19 gauge, 10-cm, Yueh catheter was introduced. An ultrasound image was saved for documentation purposes. The paracentesis was performed. The catheter was removed and a dressing was applied. The patient tolerated the procedure well without immediate post procedural complication. FINDINGS: A total of approximately 200 mL of bright yellow fluid was removed. Samples were sent to the laboratory as requested by the clinical team. IMPRESSION: Successful ultrasound-guided paracentesis yielding 200 mL of peritoneal fluid. Procedure performed by Alwyn Ren NP and supervised by Dr. Grace Isaac. PLAN: If the patient eventually requires >/=2 paracenteses in a 30 day period, candidacy for formal evaluation by the [facility] Interventional Radiology Portal  [facility] will be assessed. Electronically Signed   By: Simonne Come M.D.   On: 10/30/2022 13:49   CT ABDOMEN PELVIS W CONTRAST  Result Date: 10/29/2022 CLINICAL DATA:  Abdominal pain, nausea, and vomiting. Swollen legs and feet, abnormal bowel movements, severe fatigue, and jaundice. EXAM: CT ABDOMEN AND PELVIS WITH CONTRAST TECHNIQUE: Multidetector CT imaging of the abdomen and pelvis was performed using the standard protocol following bolus administration of intravenous contrast. RADIATION DOSE REDUCTION: This exam was performed according to the departmental dose-optimization program which includes automated exposure control, adjustment of the mA and/or kV according to patient size and/or use of iterative reconstruction technique. CONTRAST:  80mL OMNIPAQUE IOHEXOL 300 MG/ML  SOLN COMPARISON:  01/30/2023. FINDINGS: Lower chest: No acute abnormality. Hepatobiliary: Severe hepatic steatosis is noted. No focal abnormality. The gallbladder is without stones. There is suggestion of gallbladder wall thickening and mucosal enhancement with fat stranding at the gallbladder fossa. No biliary ductal dilatation. Pancreas: Unremarkable. No pancreatic ductal dilatation or surrounding inflammatory changes. Spleen: Normal in size without focal abnormality. Adrenals/Urinary Tract: The adrenal glands are within normal limits. The kidneys enhance symmetrically. No renal calculus or hydronephrosis. The bladder is unremarkable. Stomach/Bowel: Stomach is within normal limits. Appendix appears normal. No evidence of bowel obstruction. No free air or pneumatosis. Scattered diverticula are present along the colon without evidence of diverticulitis. There is mild diffuse colonic wall thickening. Vascular/Lymphatic: Aortic atherosclerosis. No enlarged abdominal or pelvic lymph nodes. Few perigastric and periesophageal varices are noted. Reproductive: The uterus is within normal limits. There is a 3.3 cm cyst in the right  adnexa, likely ovarian in origin. No adnexal mass on the left. Other: Small ascites in all 4 quadrants.  Mild anasarca is noted. Musculoskeletal: No acute osseous abnormality. IMPRESSION: 1. Gallbladder wall thickening with mucosal enhancement. Ultrasound is recommended for further evaluation. 2. Severe hepatic steatosis. 3. Mild diffuse colonic wall thickening, possible  colitis. 4. Mild ascites and anasarca. 5. Aortic atherosclerosis. Electronically Signed   By: Thornell Sartorius M.D.   On: 10/29/2022 21:57   DG Chest 2 View  Result Date: 10/29/2022 CLINICAL DATA:  Shortness of breath. EXAM: CHEST - 2 VIEW COMPARISON:  None Available. FINDINGS: The heart size and mediastinal contours are within normal limits. Both lungs are clear. The visualized skeletal structures are unremarkable. IMPRESSION: No active cardiopulmonary disease. Electronically Signed   By: Elgie Collard M.D.   On: 10/29/2022 20:12    Labs: BNP (last 3 results) No results for input(s): "BNP" in the last 8760 hours. Basic Metabolic Panel: Recent Labs  Lab 11/01/22 0339 11/02/22 0051 11/03/22 0207 11/04/22 0351 11/05/22 0421 11/06/22 0134  NA 132* 131* 131* 130* 131* 128*  K 3.1* 3.8 3.2* 4.0 3.6 3.1*  CL 96* 97* 95* 97* 98 95*  CO2 25 26 27 23 25 25   GLUCOSE 90 88 92 81 79 96  BUN <5* 5* 5* 8 9 8   CREATININE 0.62 0.61 0.65 0.58 0.60 0.65  CALCIUM 8.5* 8.1* 8.1* 7.8* 8.0* 8.0*  MG 1.8 1.9  --  2.3  --   --    Liver Function Tests: Recent Labs  Lab 11/02/22 0051 11/03/22 0207 11/04/22 0351 11/05/22 0421 11/06/22 0134  AST 357* 309* 255* 214* 190*  ALT 98* 95* 80* 73* 73*  ALKPHOS 261* 262* 234* 218* 194*  BILITOT 24.9* 28.3* 28.1* 27.9* 28.0*  PROT 4.7* 5.1* 4.9* 4.8* 4.9*  ALBUMIN 1.7* 1.8* 1.7* 1.7* 1.8*   No results for input(s): "LIPASE", "AMYLASE" in the last 168 hours. No results for input(s): "AMMONIA" in the last 168 hours. CBC: Recent Labs  Lab 11/01/22 0339 11/02/22 0051 11/06/22 0134  11/07/22 0219  WBC 6.9 7.6 6.2 6.6  NEUTROABS  --   --  4.0 4.3  HGB 8.9* 9.0* 9.1* 9.1*  HCT 26.9* 26.6* 27.5* 28.0*  MCV 112.1* 114.2* 116.5* 114.8*  PLT 184 221 188 214   Cardiac Enzymes: No results for input(s): "CKTOTAL", "CKMB", "CKMBINDEX", "TROPONINI" in the last 168 hours. BNP: Invalid input(s): "POCBNP" CBG: No results for input(s): "GLUCAP" in the last 168 hours. D-Dimer No results for input(s): "DDIMER" in the last 72 hours. Hgb A1c No results for input(s): "HGBA1C" in the last 72 hours. Lipid Profile No results for input(s): "CHOL", "HDL", "LDLCALC", "TRIG", "CHOLHDL", "LDLDIRECT" in the last 72 hours. Thyroid function studies No results for input(s): "TSH", "T4TOTAL", "T3FREE", "THYROIDAB" in the last 72 hours.  Invalid input(s): "FREET3" Anemia work up No results for input(s): "VITAMINB12", "FOLATE", "FERRITIN", "TIBC", "IRON", "RETICCTPCT" in the last 72 hours. Urinalysis    Component Value Date/Time   COLORURINE ORANGE (A) 10/29/2022 1931   APPEARANCEUR HAZY (A) 10/29/2022 1931   LABSPEC 1.015 10/29/2022 1931   PHURINE 6.5 10/29/2022 1931   GLUCOSEU NEGATIVE 10/29/2022 1931   HGBUR NEGATIVE 10/29/2022 1931   BILIRUBINUR LARGE (A) 10/29/2022 1931   KETONESUR NEGATIVE 10/29/2022 1931   PROTEINUR TRACE (A) 10/29/2022 1931   NITRITE NEGATIVE 10/29/2022 1931   LEUKOCYTESUR NEGATIVE 10/29/2022 1931   Sepsis Labs Recent Labs  Lab 11/01/22 0339 11/02/22 0051 11/06/22 0134 11/07/22 0219  WBC 6.9 7.6 6.2 6.6   Microbiology Recent Results (from the past 240 hour(s))  Culture, body fluid w Gram Stain-bottle     Status: None   Collection Time: 10/30/22  1:41 PM   Specimen: Peritoneal Washings  Result Value Ref Range Status   Specimen Description PERITONEAL  Final  Special Requests NONE  Final   Culture   Final    NO GROWTH 5 DAYS Performed at [facility], [address] [place], [place] [address]    Report Status 11/04/2022 FINAL  Final   Gram stain     Status: None   Collection Time: 10/30/22  1:41 PM   Specimen: Peritoneal Washings  Result Value Ref Range Status   Specimen Description PERITONEAL  Final   Special Requests NONE  Final   Gram Stain   Final    WBC PRESENT, PREDOMINANTLY MONONUCLEAR NO ORGANISMS SEEN CYTOSPIN SMEAR Performed at [facility], [address], [place], [place] [address]    Report Status 10/30/2022 FINAL  Final     Time coordinating discharge: 25 minutes  SIGNED: Lanae Boast, MD  [facility] 11/07/2022, 10:02 AM  If 7PM-7AM, please contact night-coverage [email or web address]

## 2022-11-07 NOTE — Plan of Care (Signed)
  Problem: Nutrition: Goal: Adequate nutrition will be maintained Outcome: Adequate for Discharge   Problem: Education: Goal: Knowledge of General Education information will improve Description: Including pain rating scale, medication(s)/side effects and non-pharmacologic comfort measures Outcome: Adequate for Discharge   Problem: Health Behavior/Discharge Planning: Goal: Ability to manage health-related needs will improve Outcome: Adequate for Discharge   Problem: Clinical Measurements: Goal: Ability to maintain clinical measurements within normal limits will improve Outcome: Adequate for Discharge Goal: Will remain free from infection Outcome: Adequate for Discharge Goal: Diagnostic test results will improve Outcome: Adequate for Discharge Goal: Respiratory complications will improve Outcome: Adequate for Discharge Goal: Cardiovascular complication will be avoided Outcome: Adequate for Discharge   Problem: Activity: Goal: Risk for activity intolerance will decrease Outcome: Adequate for Discharge   Problem: Nutrition: Goal: Adequate nutrition will be maintained Outcome: Adequate for Discharge   Problem: Coping: Goal: Level of anxiety will decrease Outcome: Adequate for Discharge   Problem: Elimination: Goal: Will not experience complications related to bowel motility Outcome: Adequate for Discharge Goal: Will not experience complications related to urinary retention Outcome: Adequate for Discharge   Problem: Pain Managment: Goal: General experience of comfort will improve Outcome: Adequate for Discharge   Problem: Safety: Goal: Ability to remain free from injury will improve Outcome: Adequate for Discharge   Problem: Skin Integrity: Goal: Risk for impaired skin integrity will decrease Outcome: Adequate for Discharge

## 2022-11-07 NOTE — TOC Transition Note (Signed)
Transition of Care [facility]) - CM/SW Discharge Note   Patient Details  Name: Tanya Goodman MRN: [id number] Date of Birth: [birth year]  [facility] CM/SW Contact:  Tom-Johnson, Hershal Coria, RN Phone Number: 11/07/2022, 11:40 AM   Clinical Narrative:     Patient is scheduled for discharge today.  Readmission Risk Assessment done. New patient establishment, Outpatient referral, hospital f/u and discharge instructions on AVS. MATCH done for Prescription assistance and Prescriptions sent to [facility] pharmacy, meds will be delivered to patient at bedside prior discharge. Mother, Lance Coon to transport at discharge.  No further TOC needs noted.     Final next level of care: Home/Self Care Barriers to Discharge: Barriers Resolved   Patient Goals and CMS Choice CMS Medicare.gov Compare Post Acute Care list provided to:: Patient Choice offered to / list presented to : Patient, Parent (Mother)  Discharge Placement                  Patient to be transferred to facility by: Mother Name of family member notified: Georgette    Discharge Plan and Services Additional resources added to the After Visit Summary for   In-house Referral: PCP / [facility] Discharge Planning Services: MATCH Program, Follow-up appt scheduled            DME Arranged: N/A DME Agency: NA       HH Arranged: NA HH Agency: NA        Social Determinants of Health (SDOH) Interventions SDOH Screenings   Food Insecurity: Food Insecurity Present (11/02/2022)  Housing: Medium Risk (10/30/2022)  Transportation Needs: No Transportation Needs (10/30/2022)  Utilities: At Risk (10/30/2022)  Depression (PHQ2-9): High Risk (07/13/2022)  Tobacco Use: High Risk (10/29/2022)     Readmission Risk Interventions    10/30/2022   11:10 AM  Readmission Risk Prevention Plan  Post Dischage Appt Complete  Medication Screening Complete  Transportation Screening Complete

## 2022-11-11 ENCOUNTER — Other Ambulatory Visit (HOSPITAL_COMMUNITY): Payer: Self-pay

## 2022-11-13 ENCOUNTER — Ambulatory Visit (INDEPENDENT_AMBULATORY_CARE_PROVIDER_SITE_OTHER): Payer: Medicaid Other | Admitting: Nurse Practitioner

## 2022-11-13 ENCOUNTER — Encounter: Payer: Self-pay | Admitting: Nurse Practitioner

## 2022-11-13 ENCOUNTER — Other Ambulatory Visit (INDEPENDENT_AMBULATORY_CARE_PROVIDER_SITE_OTHER): Payer: Self-pay

## 2022-11-13 VITALS — BP 90/60 | HR 100 | Ht 64.0 in | Wt 161.4 lb

## 2022-11-13 DIAGNOSIS — K7011 Alcoholic hepatitis with ascites: Secondary | ICD-10-CM | POA: Diagnosis not present

## 2022-11-13 LAB — COMPREHENSIVE METABOLIC PANEL
ALT: 186 U/L — ABNORMAL HIGH (ref 0–35)
AST: 285 U/L — ABNORMAL HIGH (ref 0–37)
Albumin: 3.4 g/dL — ABNORMAL LOW (ref 3.5–5.2)
Alkaline Phosphatase: 159 U/L — ABNORMAL HIGH (ref 39–117)
BUN: 14 mg/dL (ref 6–23)
CO2: 23 mEq/L (ref 19–32)
Calcium: 9.1 mg/dL (ref 8.4–10.5)
Chloride: 96 mEq/L (ref 96–112)
Creatinine, Ser: 0.48 mg/dL (ref 0.40–1.20)
GFR: 122.19 mL/min (ref >60.00)
Glucose, Bld: 106 mg/dL — ABNORMAL HIGH (ref 70–99)
Potassium: 3.6 mEq/L (ref 3.5–5.1)
Sodium: 130 mEq/L — ABNORMAL LOW (ref 135–145)
Total Bilirubin: 15 mg/dL — ABNORMAL HIGH (ref 0.2–1.2)
Total Protein: 6.1 g/dL (ref 6.0–8.3)

## 2022-11-13 LAB — CBC
HCT: 34.1 % — ABNORMAL LOW (ref 36.0–46.0)
Hemoglobin: 11.4 g/dL — ABNORMAL LOW (ref 12.0–15.0)
MCHC: 33.4 g/dL (ref 30.0–36.0)
MCV: 119.1 fl — ABNORMAL HIGH (ref 78.0–100.0)
Platelets: 320 10*3/uL (ref 150.0–400.0)
RBC: 2.86 Mil/uL — ABNORMAL LOW (ref 3.87–5.11)
RDW: 16.8 % — ABNORMAL HIGH (ref 11.5–15.5)
WBC: 7.6 10*3/uL (ref 4.0–10.5)

## 2022-11-13 LAB — PROTIME-INR
INR: 1.2 ratio — ABNORMAL HIGH (ref 0.8–1.0)
Prothrombin Time: 12.7 s (ref 9.6–13.1)

## 2022-11-13 NOTE — Progress Notes (Signed)
Primary GI:  Stan Head, MD   ASSESSMENT & PLAN   37 year old female with history of alcohol abuse recently admitted for severe alcoholic hepatitis   Recent admission for severe Etoh hepatitis with ascites.  On prednisolone.  BY Lille score on 8/7 she was a nonresponder with a poor prognosis but we continued her on prednisolone pending office follow-up today.  Overall she looks about the same as she did in the hospital a few days ago.  --ANA positive, remainder of other autoimmune and genetic markers negative.  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  Unable to really tell if or how much ascites she has but there is no peripheral edema so need to monitor closely and not cause AKI.  We may need to decrease or even stop diuretics -- Thankfully she has not had any alcohol since hospital discharge --Check immunity to HAV and HBV --Will contact her with lab results and recommendations --Follow up with me in 2 months.    HPI    Chief complaint : Hospital follow-up  Jazmyn was hospitalized earlier this month with severe alcoholic hepatitis with ascites.  Diagnostic paracentesis was negative for SBP.  She was started on prednisolone.  At day 7 her Lille score showed that she was a nonresponder with a poor prognosis.  However she was discharged from the hospital that day and we opted to keep her on prednisolone until today's office follow-up .    A couple days after hospital discharge Tiffaney felt okay but now over the last couple days she has been experiencing fatigue.  She has been living at her mother's home which unfortunately is infested with mold .  She is making arrangements  stay with a friend.  She feels like jaundice has gotten worse.  No swelling in legs but feels but  feels like abdomen may be starting to distend with fluid. She has  started smoking again. No Etoh use.   Euline has had some pain in left hand where there were several venipuncture attempts in hospital.     Previous GI Endoscopies / Labs / Imaging        Latest Ref Rng & Units 11/06/2022    1:34 AM 11/05/2022    4:21 AM 11/04/2022    3:51 AM  Hepatic Function  Total Protein 6.5 - 8.1 g/dL 4.9  4.8  4.9   Albumin 3.5 - 5.0 g/dL 1.8  1.7  1.7   AST 15 - 41 U/L 190  214  255   ALT 0 - 44 U/L 73  73  80   Alk Phosphatase 38 - 126 U/L 194  218  234   Total Bilirubin 0.3 - 1.2 mg/dL 16.1  09.6  04.5        Latest Ref Rng & Units 11/07/2022    2:19 AM 11/06/2022    1:34 AM 11/02/2022   12:51 AM  CBC  WBC 4.0 - 10.5 K/uL 6.6  6.2  7.6   Hemoglobin 12.0 - 15.0 g/dL 9.1  9.1  9.0   Hematocrit 36.0 - 46.0 % 28.0  27.5  26.6   Platelets 150 - 400 K/uL 214  188  221     DNA Mutation Analysis Comment  Comment: (NOTE) Results: c.845G>A (p.Cys282Tyr) - Not Detected c.187C>G (p.His63Asp) - Detected, heterozygous c.193A>T (p.Ser65Cys) - Not Detected Not associated with increased  risk to develop clinical symptoms of Hereditary Hemochromatosis. In symptomatic individuals, other causes of iron overload should be evaluated. See Additional Information and Comments.     Past Medical History:  Diagnosis Date   Alcohol use disorder    Alcoholic hepatitis    SVD (spontaneous vaginal delivery)    x 3    Past Surgical History:  Procedure Laterality Date   IR PARACENTESIS  10/30/2022   NO PAST SURGERIES      Family History  Problem Relation Age of Onset   Colon cancer Maternal Grandmother    Liver disease Neg Hx    Alcohol abuse Neg Hx     Current Medications, Allergies, Family History and Social History were reviewed in [employer] record.     Current Outpatient Medications  Medication Sig Dispense Refill   folic acid (FOLVITE) 1 MG tablet Take 1 tablet (1 mg total) by mouth daily. 30 tablet 0   furosemide (LASIX) 40 MG  tablet Take 1 tablet (40 mg total) by mouth daily. 30 tablet 30   hydrOXYzine (ATARAX) 25 MG tablet Take 1 tablet (25 mg total) by mouth 3 (three) times daily as needed for anxiety. 90 tablet 0   melatonin 5 MG TABS Take 1 tablet (5 mg total) by mouth at bedtime. 30 tablet 0   Multiple Vitamin (MULTIVITAMIN WITH MINERALS) TABS tablet Take 1 tablet by mouth daily. 130 tablet 0   predniSONE (DELTASONE) 5 MG tablet Take 8 tablets (40 mg total) by mouth daily for 20 days. 160 tablet 0   spironolactone (ALDACTONE) 100 MG tablet Take 1 tablet (100 mg total) by mouth daily. 30 tablet 0   thiamine (VITAMIN B1) 100 MG tablet Take 1 tablet (100 mg total) by mouth daily. 30 tablet 0   No current facility-administered medications for this visit.    Review of Systems: No chest pain. No shortness of breath. No urinary complaints.    Physical Exam  Wt Readings from Last 3 Encounters:  11/13/22 161 lb 6 oz (73.2 kg)  10/29/22 190 lb 0.6 oz (86.2 kg)  09/16/17 190 lb (86.2 kg)    BP 90/60   Pulse 100   Ht 5\' 4"  (1.626 m)   Wt 161 lb 6 oz (73.2 kg)   LMP 09/24/2022 (Approximate)   SpO2 96%   BMI 27.70 kg/m  Constitutional:  Pleasant, generally well appearing female in no acute distress. Psychiatric: Normal mood and affect. Behavior is normal. EENT: Pupils normal.  Conjunctivae are normal. No scleral icterus.  Moist mucous membranes Neck supple.  Cardiovascular: Normal rate, regular rhythm.  Pulmonary/chest: Effort normal and breath sounds normal. No wheezing, rales or rhonchi. Abdominal: Soft, nondistended, nontender. Bowel sounds active throughout. There are no masses palpable. No hepatomegaly. Neurological: Alert and oriented to person place and time.  No asterixis Extremities: no edema Skin: Deeply jaundiced . s Willette Cluster, NP  11/13/2022, 10:02 AM

## 2022-11-13 NOTE — Patient Instructions (Addendum)
_______________________________________________________  If your blood pressure at your visit was 140/90 or greater, please contact your primary care physician to follow up on this.  _______________________________________________________  If you are age 36 or older, your body mass index should be between 23-30. Your Body mass index is 27.7 kg/m. If this is out of the aforementioned range listed, please consider follow up with your Primary Care Provider.  If you are age 33 or younger, your body mass index should be between 19-25. Your Body mass index is 27.7 kg/m. If this is out of the aformentioned range listed, please consider follow up with your Primary Care Provider.   ________________________________________________________  The [facility] providers would like to encourage you to use [facility] to communicate with providers for non-urgent requests or questions.  Due to long hold times on the telephone, sending your provider a message by [facility] may be a faster and more efficient way to get a response.  Please allow 48 business hours for a response.  Please remember that this is for non-urgent requests.  _______________________________________________________  Your provider has requested that you go to the basement level for lab work before leaving today. Press "B" on the elevator. The lab is located at the first door on the left as you exit the elevator.  You have been scheduled for an appointment with Willette Cluster NP on 01-23-2023 at 9am . Please arrive 10 minutes early for your appointment.  It was a pleasure to see you today!  Thank you for trusting me with your gastrointestinal care!

## 2022-11-14 LAB — HEPATITIS B SURFACE ANTIBODY,QUALITATIVE: Hep B S Ab: NONREACTIVE

## 2022-11-14 LAB — HEPATITIS A ANTIBODY, TOTAL: Hepatitis A AB,Total: REACTIVE — AB

## 2022-11-18 ENCOUNTER — Other Ambulatory Visit: Payer: Self-pay

## 2022-11-18 ENCOUNTER — Encounter: Payer: Self-pay | Admitting: Student

## 2022-11-18 ENCOUNTER — Ambulatory Visit (INDEPENDENT_AMBULATORY_CARE_PROVIDER_SITE_OTHER): Payer: Medicaid Other | Admitting: Student

## 2022-11-18 VITALS — BP 110/72 | HR 102 | Temp 99.6°F | Resp 24 | Ht 64.0 in | Wt 160.6 lb

## 2022-11-18 DIAGNOSIS — F418 Other specified anxiety disorders: Secondary | ICD-10-CM | POA: Insufficient documentation

## 2022-11-18 DIAGNOSIS — Z87891 Personal history of nicotine dependence: Secondary | ICD-10-CM

## 2022-11-18 DIAGNOSIS — K7011 Alcoholic hepatitis with ascites: Secondary | ICD-10-CM

## 2022-11-18 DIAGNOSIS — F32A Depression, unspecified: Secondary | ICD-10-CM | POA: Diagnosis not present

## 2022-11-18 DIAGNOSIS — D539 Nutritional anemia, unspecified: Secondary | ICD-10-CM

## 2022-11-18 DIAGNOSIS — Z0000 Encounter for general adult medical examination without abnormal findings: Secondary | ICD-10-CM

## 2022-11-18 DIAGNOSIS — F1090 Alcohol use, unspecified, uncomplicated: Secondary | ICD-10-CM | POA: Diagnosis not present

## 2022-11-18 DIAGNOSIS — Z720 Tobacco use: Secondary | ICD-10-CM | POA: Insufficient documentation

## 2022-11-18 MED ORDER — NICOTINE 14 MG/24HR TD PT24: 14.0000 mg | MEDICATED_PATCH | Freq: Every day | TRANSDERMAL | 0 refills | Status: DC

## 2022-11-18 MED ORDER — SERTRALINE HCL 25 MG PO TABS: 25.0000 mg | ORAL_TABLET | Freq: Every day | ORAL | 2 refills | Status: DC

## 2022-11-18 MED ORDER — NICOTINE 21 MG/24HR TD PT24: 21.0000 mg | MEDICATED_PATCH | TRANSDERMAL | 0 refills | Status: DC

## 2022-11-18 MED ORDER — NICOTINE 7 MG/24HR TD PT24: 7.0000 mg | MEDICATED_PATCH | TRANSDERMAL | 0 refills | Status: DC

## 2022-11-18 NOTE — Progress Notes (Signed)
New Patient Office Visit  Subjective    Patient ID: Tanya Goodman, female    DOB: 04-17-1985  Age: 37 y.o. MRN: [id number]  CC:  Chief Complaint  Patient presents with   Follow-up   Medication Refill    HPI Tanya Goodman is a 37 year old female presents to establish care and for a hospitalization follow up from alcoholic hepatitis with ascites.  Hospitalized, she was on Ativan for withdrawal symptoms and anxiety but did not have a seizure due to withdrawal.  Today, she reports feeling better but still has some abdominal swelling.  She denies abdominal pain.  She is able to tolerate oral intake.  Patient reports having about 9 to 10 days left of the prednisone.  She stated that the last alcoholic beverage was July 30th.  She reported having the local AA number and the ADS number.  She denied any scheduled meetings.    Outpatient Encounter Medications as of 11/18/2022  Medication Sig   nicotine (NICODERM CQ - DOSED IN MG/24 HOURS) 14 mg/24hr patch Place 1 patch (14 mg total) onto the skin daily.   nicotine (NICODERM CQ - DOSED IN MG/24 HOURS) 21 mg/24hr patch Place 1 patch (21 mg total) onto the skin daily.   nicotine (NICODERM CQ - DOSED IN MG/24 HR) 7 mg/24hr patch Place 1 patch (7 mg total) onto the skin daily.   sertraline (ZOLOFT) 25 MG tablet Take 1 tablet (25 mg total) by mouth daily.   folic acid (FOLVITE) 1 MG tablet Take 1 tablet (1 mg total) by mouth daily.   furosemide (LASIX) 40 MG tablet Take 1 tablet (40 mg total) by mouth daily.   hydrOXYzine (ATARAX) 25 MG tablet Take 1 tablet (25 mg total) by mouth 3 (three) times daily as needed for anxiety.   melatonin 5 MG TABS Take 1 tablet (5 mg total) by mouth at bedtime.   Multiple Vitamin (MULTIVITAMIN WITH MINERALS) TABS tablet Take 1 tablet by mouth daily.   predniSONE (DELTASONE) 5 MG tablet Take 8 tablets (40 mg total) by mouth daily for 20 days.   spironolactone (ALDACTONE) 100 MG tablet Take 1 tablet (100 mg total) by  mouth daily.   thiamine (VITAMIN B1) 100 MG tablet Take 1 tablet (100 mg total) by mouth daily.   No facility-administered encounter medications on file as of 11/18/2022.    Past Medical History:  Diagnosis Date   Alcohol use disorder    Alcoholic hepatitis    SVD (spontaneous vaginal delivery)    x 3    Past Surgical History:  Procedure Laterality Date   IR PARACENTESIS  10/30/2022   NO PAST SURGERIES      Family History  Problem Relation Age of Onset   Colon cancer Maternal Grandmother    Liver disease Neg Hx    Alcohol abuse Neg Hx     Social History   Socioeconomic History   Marital status: Single    Spouse name: Not on file   Number of children: Not on file   Years of education: Not on file   Highest education level: Not on file  Occupational History   Occupation: bartender  Tobacco Use   Smoking status: Every Day    Current packs/day: 1.00    Average packs/day: 1 pack/day for 17.1 years (17.1 ttl pk-yrs)    Types: Cigarettes    Start date: 10/29/2005   Smokeless tobacco: Never   Tobacco comments:    1/2 to a pack  per day   Vaping Use   Vaping status: Unknown  Substance and Sexual Activity   Alcohol use: Yes    Comment: pt states "probably too much"   Drug use: Not Currently    Types: Marijuana, Benzodiazepines    Comment: rare   Sexual activity: Not Currently  Other Topics Concern   Not on file  Social History Narrative   Not on file   Social Determinants of Health   Financial Resource Strain: Not on file  Food Insecurity: Food Insecurity Present (11/02/2022)   Hunger Vital Sign    Worried About Running Out of Food in the Last Year: Sometimes true    Ran Out of Food in the Last Year: Sometimes true  Transportation Needs: No Transportation Needs (10/30/2022)   PRAPARE - Administrator, Civil Service (Medical): No    Lack of Transportation (Non-Medical): No  Physical Activity: Not on file  Stress: Not on file  Social Connections:  Socially Isolated (11/18/2022)   Social Connection and Isolation Panel [NHANES]    Frequency of Communication with Friends and Family: More than three times a week    Frequency of Social Gatherings with Friends and Family: More than three times a week    Attends Religious Services: Never    Database administrator or Organizations: No    Attends Banker Meetings: Never    Marital Status: Never married  Intimate Partner Violence: Not At Risk (10/30/2022)   Humiliation, Afraid, Rape, and Kick questionnaire    Fear of Current or Ex-Partner: No    Emotionally Abused: No    Physically Abused: No    Sexually Abused: No       Objective    BP 110/72 (BP Location: Right Arm, Patient Position: Sitting, Cuff Size: Normal)   Pulse (!) 102   Temp 99.6 F (37.6 C) (Oral)   Resp (!) 24   Ht 5\' 4"  (1.626 m)   Wt 160 lb 9.6 oz (72.8 kg)   LMP 08/24/2022 (Approximate)   SpO2 99% Comment: room air  BMI 27.57 kg/m   Physical Exam Vitals reviewed.  Constitutional:      General: She is not in acute distress.    Appearance: She is overweight. She is not ill-appearing, toxic-appearing or diaphoretic.  Eyes:     General: Scleral icterus present.  Cardiovascular:     Rate and Rhythm: Regular rhythm. Tachycardia present.     Heart sounds: Normal heart sounds. No murmur heard. Pulmonary:     Effort: Pulmonary effort is normal. No respiratory distress.     Breath sounds: Normal breath sounds.  Abdominal:     General: Abdomen is protuberant. Bowel sounds are normal.     Palpations: Abdomen is soft.     Tenderness: There is no abdominal tenderness.  Musculoskeletal:     Right lower leg: No edema.     Left lower leg: No edema.  Skin:    General: Skin is warm and dry.     Coloration: Skin is jaundiced.  Neurological:     Mental Status: She is alert and oriented to person, place, and time.  Psychiatric:        Mood and Affect: Mood is anxious.     Last CBC Lab Results   Component Value Date   WBC 7.6 11/13/2022   HGB 11.4 (L) 11/13/2022   HCT 34.1 (L) 11/13/2022   MCV 119.1 aH (H) 11/13/2022   MCH 37.3 (H) 11/07/2022  RDW 16.8 (H) 11/13/2022   PLT 320.0 11/13/2022   Last metabolic panel Lab Results  Component Value Date   GLUCOSE 106 (H) 11/13/2022   NA 130 (L) 11/13/2022   K 3.6 11/13/2022   CL 96 11/13/2022   CO2 23 11/13/2022   BUN 14 11/13/2022   CREATININE 0.48 11/13/2022   GFR 122.19 11/13/2022   CALCIUM 9.1 11/13/2022   PROT 6.1 11/13/2022   ALBUMIN 3.4 (L) 11/13/2022   BILITOT 15.0 (H) 11/13/2022   ALKPHOS 159 (H) 11/13/2022   AST 285 (H) 11/13/2022   ALT 186 (H) 11/13/2022   ANIONGAP 8 11/06/2022          11/18/2022   11:02 AM 07/13/2022   10:52 AM 07/09/2022    7:53 PM  Depression screen PHQ 2/9  Decreased Interest 0    Down, Depressed, Hopeless 1    PHQ - 2 Score 1    Altered sleeping 3    Tired, decreased energy 1    Change in appetite 1    Feeling bad or failure about yourself  1    Trouble concentrating 2    Moving slowly or fidgety/restless 3    Suicidal thoughts 0    PHQ-9 Score 12    Difficult doing work/chores Somewhat difficult       Information is confidential and restricted. Go to Review Flowsheets to unlock data.       11/18/2022   11:05 AM  GAD 7 : Generalized Anxiety Score  Nervous, Anxious, on Edge 3  Control/stop worrying 3  Worry too much - different things 3  Trouble relaxing 3  Restless 3  Easily annoyed or irritable 2  Afraid - awful might happen 3  Total GAD 7 Score 20  Anxiety Difficulty Somewhat difficult      Assessment & Plan:   Problem List Items Addressed This Visit       Digestive   Alcoholic hepatitis    Patient was recently hospitalized for alcoholic hepatitis with ascites.  During her hospitalization she was started on prednisolone and discharged home on prednisone.  She had a GI follow-up last week where they repeated a CMP and CBC.  Patient reported that she is  currently sober and is compliant with her medications prescribed to her such as Lasix and Aldactone. Patient was provided numbers with AA and ADS, but patient has yet to call to schedule meetings. Plan -Continue alcohol cessation -Continue prednisone  -Continue Lasix and Aldactone -Continue to follow-up with GI, next appointment is in 2 months          Other   Alcohol use disorder    Patient presents with history of alcohol use disorder.  She reports that her last drink was July 30 and has been sober since then. Plan: -Patient instructed to follow-up with AA and ADS -Consider naltrexone in future appointments once hepatitis has subsided      Macrocytic anemia    During hospitalization, patient was found to have macrocytic anemia likely due to folic acid deficiency due to alcohol use disorder. Folic acid was 5.9 and Vitamin B 12 was 2,112.  Macrocytic anemia is unlikely due to vitamin B-12 deficiency. Plan: -Continue folic acid -Continue alcohol cessation        Depression with anxiety    Patient presents with history of depression with anxiety.  She stated that she had depression with anxiety in the past and took a assessment online that provided her with Zoloft, she never took the  Zoloft.  She denies official treatment or therapy for her depression and anxiety.  Current PHQ-9 score is 12 and GAD score is 20.  She denies SI/HI. Plan: -Begin Zoloft 25 mg -Referral for integrated behavioral health placed -Return to office in 4 weeks to evaluate mood      Relevant Medications   sertraline (ZOLOFT) 25 MG tablet   Tobacco use    Patient presents with history of tobacco use disorder.  During her hospitalization she was provided nicotine patches.  Patient reported that she did not have cravings for cigarettes while the nicotine patches were on.  She was provided with medication options and nicotine replacement options.  She opted to go with the nicotine patches to begin tobacco  cessation. Plan: -Patient will begin with 21 mg of nicotine patches for 2 weeks then transition to 14 mg of nicotine patches for 2 weeks and then transition to 7 mg nicotine patches for 2 weeks.       Health care maintenance    Patient is due for Pap smear.  She reported that her last Pap smear was about 10 years ago.  She would like to wait till next visit for a Pap smear Plan -Pap smear at next visit      Other Visit Diagnoses     Depression, unspecified depression type    -  Primary   Relevant Medications   sertraline (ZOLOFT) 25 MG tablet   Other Relevant Orders   Ambulatory referral to Integrated Behavioral Health        Return in about 4 weeks (around 12/16/2022) for depression/anxiety .   Faith Rogue, DO

## 2022-11-18 NOTE — Assessment & Plan Note (Signed)
Patient presents with history of alcohol use disorder.  She reports that her last drink was July 30 and has been sober since then. Plan: -Patient instructed to follow-up with AA and ADS -Consider naltrexone in future appointments once hepatitis has subsided

## 2022-11-18 NOTE — Assessment & Plan Note (Signed)
Patient is due for Pap smear.  She reported that her last Pap smear was about 10 years ago.  She would like to wait till next visit for a Pap smear Plan -Pap smear at next visit

## 2022-11-18 NOTE — Assessment & Plan Note (Signed)
Patient presents with history of tobacco use disorder.  During her hospitalization she was provided nicotine patches.  Patient reported that she did not have cravings for cigarettes while the nicotine patches were on.  She was provided with medication options and nicotine replacement options.  She opted to go with the nicotine patches to begin tobacco cessation. Plan: -Patient will begin with 21 mg of nicotine patches for 2 weeks then transition to 14 mg of nicotine patches for 2 weeks and then transition to 7 mg nicotine patches for 2 weeks.

## 2022-11-18 NOTE — Assessment & Plan Note (Signed)
Patient presents with history of depression with anxiety.  She stated that she had depression with anxiety in the past and took a assessment online that provided her with Zoloft, she never took the Zoloft.  She denies official treatment or therapy for her depression and anxiety.  Current PHQ-9 score is 12 and GAD score is 20.  She denies SI/HI. Plan: -Begin Zoloft 25 mg -Referral for integrated behavioral health placed -Return to office in 4 weeks to evaluate mood

## 2022-11-18 NOTE — Patient Instructions (Signed)
Thank you, Tanya Goodman for allowing [place] to provide your care today. Today we discussed alcohol use disorder, anemia, depression, and anxiety.         Referrals ordered today:    Referral Orders         Ambulatory referral to Integrated Behavioral Health      I have ordered the following medication/changed the following medications:   Stop the following medications: There are no discontinued medications.   Start the following medications: Meds ordered this encounter  Medications   nicotine (NICODERM CQ - DOSED IN MG/24 HOURS) 14 mg/24hr patch    Sig: Place 1 patch (14 mg total) onto the skin daily.    Dispense:  14 patch    Refill:  0   nicotine (NICODERM CQ - DOSED IN MG/24 HOURS) 21 mg/24hr patch    Sig: Place 1 patch (21 mg total) onto the skin daily.    Dispense:  14 patch    Refill:  0   nicotine (NICODERM CQ - DOSED IN MG/24 HR) 7 mg/24hr patch    Sig: Place 1 patch (7 mg total) onto the skin daily.    Dispense:  14 patch    Refill:  0   sertraline (ZOLOFT) 25 MG tablet    Sig: Take 1 tablet (25 mg total) by mouth daily.    Dispense:  30 tablet    Refill:  2     Follow up:  4 weeks      Remember: Use the 21mg  patches once a day for 14 days. Then use the 14mg  patches once a day for 14 days. Then use the 7mg  patches once a day for 14 days.  Should you have any questions or concerns please call the internal medicine clinic at [phone].     Faith Rogue, D.O. [facility]

## 2022-11-18 NOTE — Assessment & Plan Note (Addendum)
During hospitalization, patient was found to have macrocytic anemia likely due to folic acid deficiency due to alcohol use disorder. Folic acid was 5.9 and Vitamin B 12 was 2,112.  Macrocytic anemia is unlikely due to vitamin B-12 deficiency. Plan: -Continue folic acid -Continue alcohol cessation

## 2022-11-18 NOTE — Assessment & Plan Note (Signed)
Patient was recently hospitalized for alcoholic hepatitis with ascites.  During her hospitalization she was started on prednisolone and discharged home on prednisone.  She had a GI follow-up last week where they repeated a CMP and CBC.  Patient reported that she is currently sober and is compliant with her medications prescribed to her such as Lasix and Aldactone. Patient was provided numbers with AA and ADS, but patient has yet to call to schedule meetings. Plan -Continue alcohol cessation -Continue prednisone  -Continue Lasix and Aldactone -Continue to follow-up with GI, next appointment is in 2 months

## 2022-11-20 NOTE — Progress Notes (Signed)
Internal Medicine Clinic Attending  I was physically present during the key portions of the resident provided service and participated in the medical decision making of patient's management care. I reviewed pertinent patient test results.  The assessment, diagnosis, and plan were formulated together and I agree with the documentation in the resident's note.  Gust Rung, DO

## 2022-11-26 ENCOUNTER — Telehealth: Payer: Self-pay | Admitting: Student

## 2022-11-26 ENCOUNTER — Telehealth: Payer: Self-pay | Admitting: Nurse Practitioner

## 2022-11-26 ENCOUNTER — Other Ambulatory Visit: Payer: Self-pay | Admitting: *Deleted

## 2022-11-26 DIAGNOSIS — Z0000 Encounter for general adult medical examination without abnormal findings: Secondary | ICD-10-CM

## 2022-11-26 DIAGNOSIS — K7011 Alcoholic hepatitis with ascites: Secondary | ICD-10-CM

## 2022-11-26 NOTE — Telephone Encounter (Signed)
Pt needing a call back about a letter to be written. Pt states she was seen on 11/18/2022.

## 2022-11-26 NOTE — Telephone Encounter (Signed)
Called pt - no answer; left message on pt's self-identified vm of my call and to the office if further assistance is needed.

## 2022-11-26 NOTE — Telephone Encounter (Signed)
Talked to pt who stated she needs a letter stating she's unable to work at this time in order to continue receiving food stamps. Pt stated to send letter via her My Chart or mail it. Thanks

## 2022-11-26 NOTE — Telephone Encounter (Signed)
Patient is returning your call.  

## 2022-11-26 NOTE — Telephone Encounter (Signed)
Encounter has already been addressed. Patient medication has been clarified and she has been informed to come in for labs. Hours of operation and location of the lab has been given to patient.

## 2022-11-27 ENCOUNTER — Other Ambulatory Visit: Payer: Self-pay | Admitting: Nurse Practitioner

## 2022-11-28 ENCOUNTER — Other Ambulatory Visit: Payer: Self-pay | Admitting: *Deleted

## 2022-11-28 MED ORDER — PREDNISONE 10 MG PO TABS: ORAL_TABLET | ORAL | 0 refills | Status: DC

## 2022-11-28 NOTE — Telephone Encounter (Signed)
Medication for Prednisone taper has been ordered via Ms. Guenther,NP  and sent to pharmacy.

## 2022-11-29 NOTE — Telephone Encounter (Signed)
I called the pt who stated she will be unable to work for 2 months.

## 2022-12-03 ENCOUNTER — Telehealth: Payer: Self-pay | Admitting: Nurse Practitioner

## 2022-12-03 ENCOUNTER — Other Ambulatory Visit (INDEPENDENT_AMBULATORY_CARE_PROVIDER_SITE_OTHER): Payer: Medicaid Other

## 2022-12-03 DIAGNOSIS — Z0000 Encounter for general adult medical examination without abnormal findings: Secondary | ICD-10-CM | POA: Diagnosis not present

## 2022-12-03 DIAGNOSIS — K7011 Alcoholic hepatitis with ascites: Secondary | ICD-10-CM

## 2022-12-03 LAB — CBC WITH DIFFERENTIAL/PLATELET
Basophils Absolute: 0.1 10*3/uL (ref 0.0–0.1)
Basophils Relative: 1 % (ref 0.0–3.0)
Eosinophils Absolute: 0 10*3/uL (ref 0.0–0.7)
Eosinophils Relative: 0.4 % (ref 0.0–5.0)
HCT: 40.3 % (ref 36.0–46.0)
Hemoglobin: 13.7 g/dL (ref 12.0–15.0)
Lymphocytes Relative: 25.5 % (ref 12.0–46.0)
Lymphs Abs: 2.2 10*3/uL (ref 0.7–4.0)
MCHC: 33.9 g/dL (ref 30.0–36.0)
MCV: 109.8 fl — ABNORMAL HIGH (ref 78.0–100.0)
Monocytes Absolute: 0.6 10*3/uL (ref 0.1–1.0)
Monocytes Relative: 7.3 % (ref 3.0–12.0)
Neutro Abs: 5.7 10*3/uL (ref 1.4–7.7)
Neutrophils Relative %: 65.8 % (ref 43.0–77.0)
Platelets: 340 10*3/uL (ref 150.0–400.0)
RBC: 3.67 Mil/uL — ABNORMAL LOW (ref 3.87–5.11)
RDW: 15 % (ref 11.5–15.5)
WBC: 8.7 10*3/uL (ref 4.0–10.5)

## 2022-12-03 LAB — COMPREHENSIVE METABOLIC PANEL
ALT: 88 U/L — ABNORMAL HIGH (ref 0–35)
AST: 81 U/L — ABNORMAL HIGH (ref 0–37)
Albumin: 3.9 g/dL (ref 3.5–5.2)
Alkaline Phosphatase: 81 U/L (ref 39–117)
BUN: 11 mg/dL (ref 6–23)
CO2: 28 meq/L (ref 19–32)
Calcium: 9.4 mg/dL (ref 8.4–10.5)
Chloride: 96 meq/L (ref 96–112)
Creatinine, Ser: 0.53 mg/dL (ref 0.40–1.20)
GFR: 119.26 mL/min (ref >60.00)
Glucose, Bld: 108 mg/dL — ABNORMAL HIGH (ref 70–99)
Potassium: 3.6 meq/L (ref 3.5–5.1)
Sodium: 134 meq/L — ABNORMAL LOW (ref 135–145)
Total Bilirubin: 2.7 mg/dL — ABNORMAL HIGH (ref 0.2–1.2)
Total Protein: 7.2 g/dL (ref 6.0–8.3)

## 2022-12-03 NOTE — Telephone Encounter (Signed)
Patient is requesting furosemide 40mg , spironolactone 100 mg, folic acid 1mg , certavite, thiamine 100 mg, and hydroxizine 25 mg to be sent to [facility] on [address].   She says that she was originally prescribed these medication when she was in the hospital.   She also has questions on prednisone dosing.

## 2022-12-03 NOTE — Telephone Encounter (Signed)
Patient does not have a PCP.  She would like to have furosemide, spironolactone, folic acid,thiamine, and hydroxizine refilled.  Is this OK?  She was recently told to change dosing prednisone.Originally, she was taking 5mg , 8 tablets daily to equal 40mg .  She was given 10mg  tablets and didn't realize they were 10mg .  She has taken 10mg , 8 tablets daily since last Thursday.  Monday and Tuesday she took 10mg , 6 times daily.   The patient is very concerned that she has taken too much prednisone.  She stated she feels confused about her health and what is going with her.   Please advise.

## 2022-12-04 ENCOUNTER — Telehealth: Payer: Self-pay | Admitting: *Deleted

## 2022-12-04 ENCOUNTER — Other Ambulatory Visit: Payer: Self-pay

## 2022-12-04 ENCOUNTER — Other Ambulatory Visit: Payer: Self-pay | Admitting: *Deleted

## 2022-12-04 ENCOUNTER — Other Ambulatory Visit: Payer: Self-pay | Admitting: Internal Medicine

## 2022-12-04 ENCOUNTER — Other Ambulatory Visit (HOSPITAL_COMMUNITY): Payer: Self-pay

## 2022-12-04 DIAGNOSIS — F411 Generalized anxiety disorder: Secondary | ICD-10-CM

## 2022-12-04 DIAGNOSIS — K7010 Alcoholic hepatitis without ascites: Secondary | ICD-10-CM

## 2022-12-04 MED ORDER — THIAMINE HCL 100 MG PO TABS: 100.0000 mg | ORAL_TABLET | Freq: Every day | ORAL | 3 refills | Status: DC

## 2022-12-04 MED ORDER — FUROSEMIDE 40 MG PO TABS: 40.0000 mg | ORAL_TABLET | Freq: Every day | ORAL | 2 refills | Status: DC

## 2022-12-04 MED ORDER — HYDROXYZINE PAMOATE 25 MG PO CAPS: 25.0000 mg | ORAL_CAPSULE | Freq: Three times a day (TID) | ORAL | 1 refills | Status: DC | PRN

## 2022-12-04 MED ORDER — SPIRONOLACTONE 100 MG PO TABS: 100.0000 mg | ORAL_TABLET | Freq: Every day | ORAL | 2 refills | Status: DC

## 2022-12-04 MED ORDER — FOLIC ACID 1 MG PO TABS: 1.0000 mg | ORAL_TABLET | Freq: Every day | ORAL | 2 refills | Status: DC

## 2022-12-04 NOTE — Telephone Encounter (Signed)
Returned call to patient to advise of Paula's recommendations.  Alona Bene, RN had already reached out to the patient with this information and Alona Bene has sent a message to Dr Leone Payor to ask about sending hydroxizine refills.  Patient stated she would await return call from [place], [place].

## 2022-12-04 NOTE — Telephone Encounter (Signed)
Lasix, Folic Acid, and Aldactone medications re-ordered with 2 refills per Willette Cluster, NP. Message sent to Dr. Leone Payor about the possibility of refilling the medication Hydroxyzine for this patient. Awaiting response.

## 2022-12-04 NOTE — Telephone Encounter (Signed)
Called patient to check to see if she is taking the Prednisone and tapering as prescribed and to also see when the medication was started, was supposed to have been started on 12/02/22. Message left per her VM.I left a message for the patient to return my call.

## 2022-12-04 NOTE — Telephone Encounter (Signed)
B1 ordered per Willette Cluster, PA.

## 2022-12-04 NOTE — Telephone Encounter (Signed)
Called patient to inform about the ordering of the Hydroxyzine medication per Dr. Leone Payor. Patient inquired about the B1 or Thiamine vitamin 100 mg daily.

## 2022-12-04 NOTE — Addendum Note (Signed)
Addended by: Michiel Sites on: 12/04/2022 09:18 AM   Modules accepted: Orders

## 2022-12-04 NOTE — Telephone Encounter (Signed)
OK to refill hydroxyzine 25 mg tid prn 390 1 Refill

## 2022-12-04 NOTE — Addendum Note (Signed)
Addended by: Michiel Sites on: 12/04/2022 05:07 PM   Modules accepted: Orders

## 2022-12-05 NOTE — Telephone Encounter (Signed)
This encounter has been addressed.

## 2022-12-09 ENCOUNTER — Ambulatory Visit (INDEPENDENT_AMBULATORY_CARE_PROVIDER_SITE_OTHER): Payer: Medicaid Other | Admitting: Licensed Clinical Social Worker

## 2022-12-09 DIAGNOSIS — F1090 Alcohol use, unspecified, uncomplicated: Secondary | ICD-10-CM

## 2022-12-09 NOTE — BH Specialist Note (Signed)
Integrated Behavioral Health Initial In-Person Visit  MRN: [id number] Name: Tanya Goodman  Number of Integrated Behavioral Health Clinician visits: 1- Initial Visit  Session Start time: 1330    Session End time: 1430  Total time in minutes: 60   Types of Service: Introduction only  Interpretor:No. Interpretor Name and Language: N/A   Warm Hand Off Completed.        Subjective: Tanya Goodman is a 37 y.o. female accompanied by  Patient was referred by PCP for Anxiety/ Depression . Patient reports the following symptoms/concerns: The Behavioral Health Consultant [facility]) introduced themselves to the patient and provided a clear explanation of their role within the healthcare team. The [facility] emphasized their function in supporting patients with behavioral health needs and enhancing overall well-being through brief, solution-focused interventions. The patient was given the [facility] contact information for future reference and confirmed that she understood the information provided. This initial interaction is crucial as it establishes a foundation for the therapeutic relationship, ensuring the patient feels informed and supported as she navigates her health journey. The patient presents with a desire to cultivate a more independent and fulfilling life, expressing feelings of being overly focused on others at the expense of her own well-being.   Objective: Mood: NA and Affect: Appropriate Risk of harm to self or others: No plan to harm self or others   Patient and/or Family's Strengths/Protective Factors: Sense of purpose  Goals Addressed: Patient will: Reduce symptoms of: anxiety and depression Increase knowledge and/or ability of: coping skills  Demonstrate ability to: Increase healthy adjustment to current life circumstances  Progress towards Goals: Ongoing  Interventions: Interventions utilized: Motivational Interviewing, Mindfulness or Management consultant, and CBT  Cognitive Behavioral Therapy  Standardized Assessments completed: PHQ-SADS  Patient and/or Family Response: Patient agrees to ongoing therapy    Plan: Follow up with behavioral health clinician on : within 30 days  [name], MSW, LCSW-A She/Her Behavioral Health Clinician [facility]  Internal Medicine Center Direct [phone]  Fax [phone] Main Office Phone: [phone] [address] [place] [place], [place] [address] Website: [facility] Internal Medicine [facility]  [place], [place]  [place]

## 2022-12-12 ENCOUNTER — Encounter: Payer: Self-pay | Admitting: Student

## 2022-12-16 ENCOUNTER — Other Ambulatory Visit (HOSPITAL_COMMUNITY): Payer: Self-pay

## 2022-12-23 ENCOUNTER — Other Ambulatory Visit: Payer: Self-pay

## 2022-12-23 ENCOUNTER — Other Ambulatory Visit (INDEPENDENT_AMBULATORY_CARE_PROVIDER_SITE_OTHER): Payer: Medicaid Other

## 2022-12-23 DIAGNOSIS — K7011 Alcoholic hepatitis with ascites: Secondary | ICD-10-CM

## 2022-12-23 LAB — COMPREHENSIVE METABOLIC PANEL
ALT: 37 U/L — ABNORMAL HIGH (ref 0–35)
AST: 31 U/L (ref 0–37)
Albumin: 4 g/dL (ref 3.5–5.2)
Alkaline Phosphatase: 68 U/L (ref 39–117)
BUN: 12 mg/dL (ref 6–23)
CO2: 28 mEq/L (ref 19–32)
Calcium: 9.9 mg/dL (ref 8.4–10.5)
Chloride: 99 mEq/L (ref 96–112)
Creatinine, Ser: 0.63 mg/dL (ref 0.40–1.20)
GFR: 114.35 mL/min (ref >60.00)
Glucose, Bld: 104 mg/dL — ABNORMAL HIGH (ref 70–99)
Potassium: 3.9 mEq/L (ref 3.5–5.1)
Sodium: 137 mEq/L (ref 135–145)
Total Bilirubin: 1 mg/dL (ref 0.2–1.2)
Total Protein: 7.4 g/dL (ref 6.0–8.3)

## 2022-12-25 ENCOUNTER — Ambulatory Visit (INDEPENDENT_AMBULATORY_CARE_PROVIDER_SITE_OTHER): Payer: Medicaid Other | Admitting: Licensed Clinical Social Worker

## 2022-12-25 DIAGNOSIS — F1090 Alcohol use, unspecified, uncomplicated: Secondary | ICD-10-CM | POA: Diagnosis not present

## 2022-12-25 NOTE — BH Specialist Note (Signed)
Had anxiety. Enter a group called [facility] for big[facility] logged in ever night . Learned some techniques. Involved . Look forward   Self motivated .Marland Kitchen Perserverence zoom aholic didn't have any meetings so she kept going to find another resource. Everynight. To go   Afew panic atacks  Really bad similar to when she had withdrawal . Mental panic attacks take over body   Coming off of pretizone   Coping with panic attack- was doordashing during episode. Anxiety   Went to a movie  Special diet   Had a panick  Grocery   Lets plan  Low sodium, high protein  Ryan - Together for 6 years,, didn't communicate to her when he left . Previously best friend.  Revisit- Unsure  of relationship revisit this topic next visit.   Whos my person- Jeanette Caprice is supported   Told mom about AA  Car  Walk to [employer] .   High light of the week- cooking and dancing and singing with dogs - Music Alexa playa [place] Mule easy listen . Speghetti squash sundried tomatoe pest chicken

## 2022-12-25 NOTE — BH Specialist Note (Signed)
Integrated Behavioral Health via In-person Visit  12/25/2022 Tanya Goodman [id number]  Number of Integrated Behavioral Health Clinician visits: 2- Second Visit  Session Start time: 1330   Session End time: 1430  Total time in minutes: 60   Referring Provider: Faith Rogue, DO Patient/Family location: [facility] Provider location: OFFICE All persons participating in visit: Tanya Goodman and Patient Types of Service: Individual psychotherapy   Presenting Concerns: Patient and/or family reports the following symptoms/concerns: During the in-person visit today, the patient reported significant progress in managing her alcohol use disorder, having maintained sobriety for 40 days following detox. She has made positive lifestyle changes, including improved eating habits and relocating from her mother's home to living with a friend. The patient expressed anxiety about group meetings and noted that her new roommate, Tanya Goodman, and other housemates smoke marijuana frequently. She has an 19 year old daughter and is experiencing constant worry and panic attacks. The patient has been utilizing nature-based coping mechanisms, such as camping and visualizing the smell of trees, to manage her anxiety. She's showing signs of improved independence, engaging in activities like driving, shopping at [place], and cooking more. The patient reports feeling better overall but sometimes experiences a sense of emotional blankness. 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 Positively, the patient noted a reduction in anxiety while grocery shopping. She's implementing time management strategies, including allocating 20 minutes for worry. The patient continues to attend AA meetings, participating in 9 pm and 10:30 pm sessions. She expressed interest in trying new activities for enjoyment and has been engaging more in dancing and singing. Future  discussion topics include a pending relationship and a personality test. The patient mentioned looking forward to spending time with her nephew. It's noteworthy that the patient started her menstrual cycle on the day of the visit (12/25/2022). Goals for the patient include finding new sources of enjoyment, continuing to build independence, and maintaining sobriety. The next session will focus on time management skills, exploring craft activities, and discussing the outcome of her recent job interview. Overall, the patient demonstrates progress in managing her anxiety and depression without medication, while actively working on her recovery and personal growth Duration of problem: more than 8 years; Severity of problem: severe  Patient and/or Family's Strengths/Protective Factors: Sense of purpose  Goals Addressed: Patient will:  Reduce symptoms of:  Addiction    Increase knowledge and/or ability of: coping skills   Demonstrate ability to: Increase healthy adjustment to current life circumstances  Progress towards Goals: Ongoing  Interventions: Interventions utilized:  Motivational Interviewing and Solution-Focused Strategies Standardized Assessments completed: PHQ-SADS  Patient and/or Family Response: Patient agrees to continue services  Assessment: Patient currently experiencing Substance abuse.   Patient may benefit from Ongoing therapy and AA meetings.  Plan: Follow up with behavioral health clinician on : Within 14 days   I discussed the assessment and treatment plan with the patient and/or parent/guardian. They were provided an opportunity to ask questions and all were answered. They agreed with the plan and demonstrated an understanding of the instructions.   They were advised to call back or seek an in-person evaluation if the symptoms worsen or if the condition fails to improve as anticipated.  Tanya Goodman, MSW, LCSW-A She/Her Behavioral Health Clinician [facility]   [facility] Direct Dial:[phone]  Fax [phone] Main Office Phone: [phone] [address] [place] [place], [place] [address] Website: [facility]  Primary Care  [place], [place]  [place]

## 2023-01-09 ENCOUNTER — Ambulatory Visit (INDEPENDENT_AMBULATORY_CARE_PROVIDER_SITE_OTHER): Payer: No Payment, Other | Admitting: Licensed Clinical Social Worker

## 2023-01-09 DIAGNOSIS — F32A Depression, unspecified: Secondary | ICD-10-CM | POA: Diagnosis not present

## 2023-01-09 NOTE — BH Specialist Note (Signed)
Integrated Behavioral Health Follow Up In-Person Visit  MRN: [id number] Name: Tanya Goodman  Number of Integrated Behavioral Health Clinician visits: Additional Visit  Session Start time: 1330   Session End time: 1430  Total time in minutes: 60   Types of Service: Individual psychotherapy   Subjective: Tanya Goodman is a 37 y.o. female  Patient was referred by PCP for Depression. Patient reports the following symptoms/concerns: During the follow-up session, the patient reported positive developments in her daily activities and coping strategies. She has been engaging more in cooking, dancing, and singing, which appear to be beneficial for her mood and overall well-being. The patient has also shown progress in her independence, noting increased comfort with driving and shopping at stores like [employer]. Despite these improvements, she expressed feeling "blank" at times, which we explored further. On a positive note, the patient reported a significant reduction in anxiety while grocery shopping. 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 We discussed the importance of maintaining and expanding her support network during this period of personal growth and transition. The session focused on reinforcing these positive behaviors and exploring strategies to address the periods of emotional blankness she's experiencing.   Objective: Mood: NA and Affect: Appropriate Risk of harm to self or others: No plan to harm self or others    Patient and/or Family's Strengths/Protective Factors: Social connections  Goals Addressed: Patient will:  Reduce symptoms of: depression   Increase knowledge and/or ability of: coping skills   Demonstrate ability to: Increase healthy adjustment to current life circumstances  Progress towards  Goals: Ongoing  Interventions: Interventions utilized:  CBT Cognitive Behavioral Therapy Standardized Assessments completed: Not Needed  Assessment: Patient currently experiencing Depression.    Plan: Follow up with behavioral health clinician on : within 30 days.  [name], MSW, LCSW-A She/Her Behavioral Health Clinician [facility]  Internal Medicine Center Direct Dial:[phone]  Fax [phone] Main Office Phone: [phone] [address] [place] [place], [place] [address] Website: [facility] Internal Medicine [facility]  [place], [place]  [place]

## 2023-01-15 ENCOUNTER — Other Ambulatory Visit (HOSPITAL_COMMUNITY): Payer: Self-pay

## 2023-01-23 ENCOUNTER — Encounter: Payer: Self-pay | Admitting: Gastroenterology

## 2023-01-23 ENCOUNTER — Other Ambulatory Visit: Payer: Medicaid Other

## 2023-01-23 ENCOUNTER — Ambulatory Visit (INDEPENDENT_AMBULATORY_CARE_PROVIDER_SITE_OTHER): Payer: Medicaid Other | Admitting: Gastroenterology

## 2023-01-23 VITALS — BP 110/64 | HR 73 | Ht 64.0 in | Wt 153.0 lb

## 2023-01-23 DIAGNOSIS — K760 Fatty (change of) liver, not elsewhere classified: Secondary | ICD-10-CM

## 2023-01-23 DIAGNOSIS — Z8719 Personal history of other diseases of the digestive system: Secondary | ICD-10-CM | POA: Diagnosis not present

## 2023-01-23 DIAGNOSIS — Z87898 Personal history of other specified conditions: Secondary | ICD-10-CM | POA: Diagnosis not present

## 2023-01-23 LAB — CBC WITH DIFFERENTIAL/PLATELET
Basophils Absolute: 0 10*3/uL (ref 0.0–0.1)
Basophils Relative: 0.5 % (ref 0.0–3.0)
Eosinophils Absolute: 0.1 10*3/uL (ref 0.0–0.7)
Eosinophils Relative: 1.1 % (ref 0.0–5.0)
HCT: 43.9 % (ref 36.0–46.0)
Hemoglobin: 14.5 g/dL (ref 12.0–15.0)
Lymphocytes Relative: 27.2 % (ref 12.0–46.0)
Lymphs Abs: 2.5 10*3/uL (ref 0.7–4.0)
MCHC: 33 g/dL (ref 30.0–36.0)
MCV: 95.8 fL (ref 78.0–100.0)
Monocytes Absolute: 0.6 10*3/uL (ref 0.1–1.0)
Monocytes Relative: 6.7 % (ref 3.0–12.0)
Neutro Abs: 5.8 10*3/uL (ref 1.4–7.7)
Neutrophils Relative %: 64.5 % (ref 43.0–77.0)
Platelets: 361 10*3/uL (ref 150.0–400.0)
RBC: 4.59 Mil/uL (ref 3.87–5.11)
RDW: 13.9 % (ref 11.5–15.5)
WBC: 9 10*3/uL (ref 4.0–10.5)

## 2023-01-23 LAB — COMPREHENSIVE METABOLIC PANEL
ALT: 20 U/L (ref 0–35)
AST: 26 U/L (ref 0–37)
Albumin: 4.2 g/dL (ref 3.5–5.2)
Alkaline Phosphatase: 67 U/L (ref 39–117)
BUN: 13 mg/dL (ref 6–23)
CO2: 26 meq/L (ref 19–32)
Calcium: 9.9 mg/dL (ref 8.4–10.5)
Chloride: 102 meq/L (ref 96–112)
Creatinine, Ser: 0.62 mg/dL (ref 0.40–1.20)
GFR: 114.72 mL/min (ref >60.00)
Glucose, Bld: 102 mg/dL — ABNORMAL HIGH (ref 70–99)
Potassium: 4.3 meq/L (ref 3.5–5.1)
Sodium: 135 meq/L (ref 135–145)
Total Bilirubin: 0.6 mg/dL (ref 0.2–1.2)
Total Protein: 7.6 g/dL (ref 6.0–8.3)

## 2023-01-23 LAB — PROTIME-INR
INR: 1.3 {ratio} — ABNORMAL HIGH (ref 0.8–1.0)
Prothrombin Time: 13.4 s — ABNORMAL HIGH (ref 9.6–13.1)

## 2023-01-23 NOTE — Patient Instructions (Signed)
Your provider has requested that you go to the basement level for lab work before leaving today. Press "B" on the elevator. The lab is located at the first door on the left as you exit the elevator.   Due to recent changes in healthcare laws, you may see the results of your imaging and laboratory studies on MyChart before your provider has had a chance to review them.  We understand that in some cases there may be results that are confusing or concerning to you. Not all laboratory results come back in the same time frame and the provider may be waiting for multiple results in order to interpret others.  Please give [place] 48 hours in order for your provider to thoroughly review all the results before contacting the office for clarification of your results.  _______________________________________________________  If your blood pressure at your visit was 140/90 or greater, please contact your primary care physician to follow up on this.  _______________________________________________________  If you are age 2 or older, your body mass index should be between 23-30. Your Body mass index is 26.26 kg/m. If this is out of the aforementioned range listed, please consider follow up with your Primary Care Provider.  If you are age 29 or younger, your body mass index should be between 19-25. Your Body mass index is 26.26 kg/m. If this is out of the aformentioned range listed, please consider follow up with your Primary Care Provider.   ________________________________________________________  The [facility] providers would like to encourage you to use [facility] to communicate with providers for non-urgent requests or questions.  Due to long hold times on the telephone, sending your provider a message by [facility] may be a faster and more efficient way to get a response.  Please allow 48 business hours for a response.  Please remember that this is for non-urgent requests.   _______________________________________________________   I appreciate the  opportunity to care for you  Thank You   [place]

## 2023-01-23 NOTE — Progress Notes (Addendum)
Chief Complaint: Follow-up of alcoholic hepatitis Primary GI MD: Dr. Leone Payor  HPI: 37 year old female history of alcoholic hepatitis presents for follow-up.  Last seen November 13, 2022 by Willette Cluster, NP.  Please see her note for details.  Patient had recent admission in early August for severe alcoholic hepatitis with ascites.  Diagnostic paracentesis was negative for SBP and patient was started on prednisolone.  At day 7 her Lillle score showed she was a nonresponder with a poor prognosis but she was discharged on prednisolone until office follow-up with Gunnar Fusi.  Workup notable for ANA positive, remainder of autoimmune and genetic markers were negative.  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  Repeat labs 11/13/2022 were improving so patient was recommended to continue prednisone for 20 days and then tapered down  Follow-up labs 12/03/2022 showed a significant improvement with AST 81/ALT 88/alk phos 81.  And total bilirubin down to 2.7 (from 28 early August).  Follow-up lab work 12/23/2022 showed almost complete normalization of LFTs  Patient states today she is doing well.  She is currently in AA and refraining from alcohol.  She is on Aldactone 100 Mg and Lasix 40 Mg and has had no further ascites or peripheral edema.  She states she has overall body aches but these are starting to improve.    PREVIOUS GI WORKUP   CT abdomen pelvis with contrast 10/29/2022 1. Gallbladder wall thickening with mucosal enhancement. Ultrasound is recommended for further evaluation. 2. Severe hepatic steatosis. 3. Mild diffuse colonic wall thickening, possible colitis. 4. Mild ascites and anasarca. 5. Aortic atherosclerosis.  Past Medical History:  Diagnosis Date   Alcohol use disorder    Alcoholic hepatitis    SVD (spontaneous vaginal delivery)    x 3    Past Surgical History:  Procedure Laterality Date   IR PARACENTESIS  10/30/2022   NO PAST  SURGERIES      Current Outpatient Medications  Medication Sig Dispense Refill   folic acid (FOLVITE) 1 MG tablet Take 1 tablet (1 mg total) by mouth daily. 30 tablet 2   furosemide (LASIX) 40 MG tablet Take 1 tablet (40 mg total) by mouth daily. 30 tablet 30   furosemide (LASIX) 40 MG tablet Take 1 tablet (40 mg total) by mouth daily. 30 tablet 2   hydrOXYzine (VISTARIL) 25 MG capsule Take 1 capsule (25 mg total) by mouth 3 (three) times daily as needed for anxiety. 390 capsule 1   melatonin 5 MG TABS Take 1 tablet (5 mg total) by mouth at bedtime. 30 tablet 0   Multiple Vitamin (MULTIVITAMIN WITH MINERALS) TABS tablet Take 1 tablet by mouth daily. 130 tablet 0   nicotine (NICODERM CQ - DOSED IN MG/24 HOURS) 14 mg/24hr patch Place 1 patch (14 mg total) onto the skin daily. 14 patch 0   nicotine (NICODERM CQ - DOSED IN MG/24 HOURS) 21 mg/24hr patch Place 1 patch (21 mg total) onto the skin daily. 14 patch 0   nicotine (NICODERM CQ - DOSED IN MG/24 HR) 7 mg/24hr patch Place 1 patch (7 mg total) onto the skin daily. 14 patch 0   sertraline (ZOLOFT) 25 MG tablet Take 1 tablet (25 mg total) by mouth daily. 30 tablet 2   spironolactone (ALDACTONE) 100 MG tablet Take 1 tablet (100 mg total) by mouth daily. 30 tablet 2   thiamine (VITAMIN B1) 100 MG tablet Take 1 tablet (100 mg total) by mouth daily. 30 tablet 3   No current facility-administered  medications for this visit.    Allergies as of 01/23/2023   (No Known Allergies)    Family History  Problem Relation Age of Onset   Colon cancer Maternal Grandmother    Liver disease Neg Hx    Alcohol abuse Neg Hx     Social History   Socioeconomic History   Marital status: Single    Spouse name: Not on file   Number of children: Not on file   Years of education: Not on file   Highest education level: Not on file  Occupational History   Occupation: bartender  Tobacco Use   Smoking status: Every Day    Current packs/day: 1.00    Average  packs/day: 1 pack/day for 17.2 years (17.2 ttl pk-yrs)    Types: Cigarettes    Start date: 10/29/2005   Smokeless tobacco: Never   Tobacco comments:    1/2 to a pack per day   Vaping Use   Vaping status: Unknown  Substance and Sexual Activity   Alcohol use: Yes    Comment: pt states "probably too much"   Drug use: Not Currently    Types: Marijuana, Benzodiazepines    Comment: rare   Sexual activity: Not Currently  Other Topics Concern   Not on file  Social History Narrative   Not on file   Social Determinants of Health   Financial Resource Strain: Not on file  Food Insecurity: Food Insecurity Present (11/02/2022)   Hunger Vital Sign    Worried About Running Out of Food in the Last Year: Sometimes true    Ran Out of Food in the Last Year: Sometimes true  Transportation Needs: No Transportation Needs (10/30/2022)   PRAPARE - Administrator, Civil Service (Medical): No    Lack of Transportation (Non-Medical): No  Physical Activity: Not on file  Stress: Not on file  Social Connections: Socially Isolated (11/18/2022)   Social Connection and Isolation Panel [NHANES]    Frequency of Communication with Friends and Family: More than three times a week    Frequency of Social Gatherings with Friends and Family: More than three times a week    Attends Religious Services: Never    Database administrator or Organizations: No    Attends Banker Meetings: Never    Marital Status: Never married  Intimate Partner Violence: Not At Risk (10/30/2022)   Humiliation, Afraid, Rape, and Kick questionnaire    Fear of Current or Ex-Partner: No    Emotionally Abused: No    Physically Abused: No    Sexually Abused: No    Review of Systems:    Constitutional: No weight loss, fever, chills, weakness or fatigue HEENT: Eyes: No change in vision               Ears, Nose, Throat:  No change in hearing or congestion Skin: No rash or itching Cardiovascular: No chest pain, chest  pressure or palpitations   Respiratory: No SOB or cough Gastrointestinal: See HPI and otherwise negative Genitourinary: No dysuria or change in urinary frequency Neurological: No headache, dizziness or syncope Musculoskeletal: No new muscle or joint pain Hematologic: No bleeding or bruising Psychiatric: No history of depression or anxiety    Physical Exam:  Vital signs: There were no vitals taken for this visit.  Constitutional: NAD, Well developed, Well nourished, alert and cooperative Head:  Normocephalic and atraumatic. Eyes:   PEERL, EOMI. No icterus. Conjunctiva pink. Respiratory: Respirations even and unlabored. Lungs clear to  auscultation bilaterally.   No wheezes, crackles, or rhonchi.  Cardiovascular:  Regular rate and rhythm. No peripheral edema, cyanosis or pallor.  Gastrointestinal:  Soft, nondistended, nontender. No rebound or guarding. Normal bowel sounds. No appreciable masses or hepatomegaly. Rectal:  Not performed.  Msk:  Symmetrical without gross deformities. Without edema, no deformity or joint abnormality.  Neurologic:  Alert and  oriented x4;  grossly normal neurologically.  Skin:   Dry and intact without significant lesions or rashes. Psychiatric: Oriented to person, place and time. Demonstrates good judgement and reason without abnormal affect or behaviors.   RELEVANT LABS AND IMAGING: CBC    Component Value Date/Time   WBC 8.7 12/03/2022 1226   RBC 3.67 (L) 12/03/2022 1226   HGB 13.7 12/03/2022 1226   HCT 40.3 12/03/2022 1226   PLT 340.0 12/03/2022 1226   MCV 109.8 (H) 12/03/2022 1226   MCH 37.3 (H) 11/07/2022 0219   MCHC 33.9 12/03/2022 1226   RDW 15.0 12/03/2022 1226   LYMPHSABS 2.2 12/03/2022 1226   MONOABS 0.6 12/03/2022 1226   EOSABS 0.0 12/03/2022 1226   BASOSABS 0.1 12/03/2022 1226    CMP     Component Value Date/Time   NA 137 12/23/2022 1245   K 3.9 12/23/2022 1245   CL 99 12/23/2022 1245   CO2 28 12/23/2022 1245   GLUCOSE 104 (H)  12/23/2022 1245   BUN 12 12/23/2022 1245   CREATININE 0.63 12/23/2022 1245   CALCIUM 9.9 12/23/2022 1245   PROT 7.4 12/23/2022 1245   ALBUMIN 4.0 12/23/2022 1245   AST 31 12/23/2022 1245   ALT 37 (H) 12/23/2022 1245   ALKPHOS 68 12/23/2022 1245   BILITOT 1.0 12/23/2022 1245   GFRNONAA >60 11/06/2022 0134     Assessment/Plan:   37 year old female admitted with alcoholic hepatitis early August 2024 with ascites.  Paracentesis negative for SBP.  Patient was put on prednisolone and initially was a nonresponder per her Jonna Clark score at day 7 but remained on medication until outpatient follow-up in which her labs continue to improve so she completed a long prednisone taper with near normalization in labs and is currently refraining from alcohol.  Remains on Aldactone 100 Mg and Lasix 40 Mg  History of alcoholic hepatitis - Educated patient on alcoholic hepatitis and hepatic steatosis and provided patient education handouts - Repeat CBC, CMP, PT/INR - Encouraged patient to continue with her AA meetings and to refrain from alcohol - Pending labs may consider decreasing or discontinuing diuretics.  If fluid retention returns we can restart these medications.  Will wait for labs to return prior to making that decision - Follow-up 4 to 6 months  Kemiya Batdorf Jolee Ewing [facility] Gastroenter[facility] 01/23/2023, 8:44 AM  Cc: Faith Rogue, DO  Primary gastroenterologist: The patient has had recovery from severe alcoholic hepatitis.  Repeat labs normal CBC c-Met and INR almost normal at 1.3.  This is great news.  Hopefully she will continue abstinence.  I do think she could discontinue diuretics.  Wt Readings from Last 3 Encounters:  01/23/23 153 lb (69.4 kg)  11/18/22 160 lb 9.6 oz (72.8 kg)  11/13/22 161 lb 6 oz (73.2 kg)   Iva Boop, MD, [facility]

## 2023-01-27 ENCOUNTER — Telehealth: Payer: Self-pay | Admitting: *Deleted

## 2023-01-27 ENCOUNTER — Ambulatory Visit (INDEPENDENT_AMBULATORY_CARE_PROVIDER_SITE_OTHER): Payer: Medicaid Other | Admitting: Licensed Clinical Social Worker

## 2023-01-27 DIAGNOSIS — F32A Depression, unspecified: Secondary | ICD-10-CM | POA: Diagnosis not present

## 2023-01-27 NOTE — Telephone Encounter (Signed)
Patient is advised that she may discontinue diuretics (aldactone and lasix). She is to let us know if she begins to have fluid retention. Patient verbalizes understanding.
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
## 2023-01-27 NOTE — Telephone Encounter (Signed)
-----   Message from Legrand Como sent at 01/27/2023  8:30 AM EDT ----- Regarding: stopping dieuretics Good morning, Dottie!  This patient can be informed to stop her diuretics. If fluid retention returns please have her let us know. Thanks so much!  Thanks! -BM

## 2023-02-11 ENCOUNTER — Encounter: Payer: Medicaid Other | Admitting: Student

## 2023-02-12 ENCOUNTER — Ambulatory Visit (INDEPENDENT_AMBULATORY_CARE_PROVIDER_SITE_OTHER): Payer: Medicaid Other | Admitting: Licensed Clinical Social Worker

## 2023-02-12 DIAGNOSIS — F32A Depression, unspecified: Secondary | ICD-10-CM
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
## 2023-02-20 ENCOUNTER — Ambulatory Visit (INDEPENDENT_AMBULATORY_CARE_PROVIDER_SITE_OTHER): Payer: Medicaid Other | Admitting: Licensed Clinical Social Worker

## 2023-02-20 DIAGNOSIS — F32A Depression, unspecified: Secondary | ICD-10-CM | POA: Diagnosis not present

## 2023-02-24 NOTE — BH Specialist Note (Signed)
Integrated Behavioral Health via Telemedicine Visit  02/20/2023 [name] [id number]  Number of Integrated Behavioral Health Clinician visits: Additional Visit  Session Start time: 1330   Session End time: 1430  Total time in minutes: 60    I connected with [name] via  Telephone email and verified that I am speaking with the correct person using two identifiers. Discussed confidentiality: Yes   I discussed the limitations of telemedicine and the availability of in person appointments.  Discussed there is a possibility of technology failure and discussed alternative modes of communication if that failure occurs.  I discussed that engaging in this telemedicine visit, they consent to the provision of behavioral healthcare and the services will be billed under their insurance.  Patient and/or legal guardian expressed understanding and consented to Telemedicine visit: Yes   Presenting Concerns: Patient and/or family reports the following symptoms/concerns: Patient reported not having a vehicle and needing reschedule. Patient sent an email with availability and [facility] sent request for patient to be scheduled .   [name], MSW, LCSW-A She/Her Behavioral Health Clinician [facility]  Internal Medicine Center Direct [phone]  Fax [phone] Main Office Phone: [phone] [address] [place] [place], [place] [address] Website: [facility] Internal Medicine [facility]  [place], [place]  [place]

## 2023-03-10 ENCOUNTER — Ambulatory Visit (INDEPENDENT_AMBULATORY_CARE_PROVIDER_SITE_OTHER): Payer: Medicaid Other | Admitting: Licensed Clinical Social Worker

## 2023-03-10 DIAGNOSIS — F32A Depression, unspecified: Secondary | ICD-10-CM | POA: Diagnosis not present

## 2023-03-10 NOTE — BH Specialist Note (Unsigned)
Integrated Behavioral Health via Telemedicine Visit  03/10/2023 Tanya Goodman [id number]  Number of Integrated Behavioral Health Clinician visits: Additional Visit  Session Start time: 1451   Session End time: 1551  Total time in minutes: 60   Referring Provider: *** Patient/Family location: [facility] Provider location: *** All persons participating in visit: *** Types of Service: {CHL AMB TYPE OF SERVICE:[phone]}  I connected with Tanya Goodman and/or Tanya R Nylander's {family members:20773} via  Telephone or Video Enabled Telemedicine Application  (Video is Caregility application) and verified that I am speaking with the correct person using two identifiers. Discussed confidentiality: {YES/NO:21197}  I discussed the limitations of telemedicine and the availability of in person appointments.  Discussed there is a possibility of technology failure and discussed alternative modes of communication if that failure occurs.  I discussed that engaging in this telemedicine visit, they consent to the provision of behavioral healthcare and the services will be billed under their insurance.  Patient and/or legal guardian expressed understanding and consented to Telemedicine visit: {YES/NO:21197}  Presenting Concerns: Patient and/or family reports the following symptoms/concerns: *** Duration of problem: ***; Severity of problem: {Mild/Moderate/Severe:20260}  Patient and/or Family's Strengths/Protective Factors: {CHL AMB BH PROTECTIVE FACTORS:[phone]}  Goals Addressed: Patient will:  Reduce symptoms of: {IBH Symptoms:21014056}   Increase knowledge and/or ability of: {IBH Patient Tools:21014057}   Demonstrate ability to: {IBH Goals:21014053}  Progress towards Goals: {CHL AMB BH PROGRESS TOWARDS [phone]}  Interventions: Interventions utilized:  {IBH Interventions:21014054} Standardized Assessments completed: {IBH Screening Tools:21014051}  Patient and/or  Family Response: ***  Assessment: Patient currently experiencing ***.   Patient may benefit from ***.  Plan: Follow up with behavioral health clinician on : *** Behavioral recommendations: *** Referral(s): {IBH Referrals:21014055}  I discussed the assessment and treatment plan with the patient and/or parent/guardian. They were provided an opportunity to ask questions and all were answered. They agreed with the plan and demonstrated an understanding of the instructions.   They were advised to call back or seek an in-person evaluation if the symptoms worsen or if the condition fails to improve as anticipated.  [name]

## 2023-03-11 ENCOUNTER — Ambulatory Visit (INDEPENDENT_AMBULATORY_CARE_PROVIDER_SITE_OTHER): Payer: Medicaid Other | Admitting: Student

## 2023-03-11 ENCOUNTER — Encounter: Payer: Self-pay | Admitting: Student

## 2023-03-11 VITALS — BP 113/58 | HR 87 | Temp 98.1°F | Ht 64.0 in | Wt 157.7 lb

## 2023-03-11 DIAGNOSIS — Z0000 Encounter for general adult medical examination without abnormal findings: Secondary | ICD-10-CM

## 2023-03-11 DIAGNOSIS — F1090 Alcohol use, unspecified, uncomplicated: Secondary | ICD-10-CM | POA: Diagnosis not present

## 2023-03-11 DIAGNOSIS — K7011 Alcoholic hepatitis with ascites: Secondary | ICD-10-CM | POA: Diagnosis not present

## 2023-03-11 DIAGNOSIS — M25561 Pain in right knee: Secondary | ICD-10-CM | POA: Diagnosis not present

## 2023-03-11 DIAGNOSIS — G8929 Other chronic pain: Secondary | ICD-10-CM

## 2023-03-11 DIAGNOSIS — F418 Other specified anxiety disorders: Secondary | ICD-10-CM

## 2023-03-11 DIAGNOSIS — K7010 Alcoholic hepatitis without ascites: Secondary | ICD-10-CM | POA: Diagnosis not present

## 2023-03-11 DIAGNOSIS — M25562 Pain in left knee: Secondary | ICD-10-CM | POA: Diagnosis not present

## 2023-03-11 DIAGNOSIS — Z720 Tobacco use: Secondary | ICD-10-CM | POA: Diagnosis not present

## 2023-03-11 DIAGNOSIS — M25569 Pain in unspecified knee: Secondary | ICD-10-CM | POA: Insufficient documentation

## 2023-03-11 DIAGNOSIS — Z124 Encounter for screening for malignant neoplasm of cervix: Secondary | ICD-10-CM

## 2023-03-11 MED ORDER — ADULT MULTIVITAMIN W/MINERALS CH: 1.0000 | ORAL_TABLET | Freq: Every day | ORAL | 3 refills | Status: DC

## 2023-03-11 MED ORDER — ADULT MULTIVITAMIN W/MINERALS CH: 1.0000 | ORAL_TABLET | Freq: Every day | ORAL | Status: AC

## 2023-03-11 NOTE — Assessment & Plan Note (Signed)
Declined medicine at earlier visit.  Is speaking with our counselor beyond that, that is helping her greatly.  She feels very well, does not feel she needs medicines right now.  I agree.  Continue to monitor.

## 2023-03-11 NOTE — Assessment & Plan Note (Signed)
Hospitalized over summer 2024 for this.  She is now at 4 months of sobriety from alcohol.  She is in touch with GI regarding her liver care.  Recently stopped her Lasix.  Remains on Aldactone.  Stable, continue with GI.  Next appointment is in February.

## 2023-03-11 NOTE — Assessment & Plan Note (Addendum)
Would behove her to stop.  She was given patches in the past but did not pick them up.  She wants to wait for now.  I think this is okay, canl revisit tobacco quitting next year.  This year she has been very successful with stopping alcohol, I think she can be similarly successful in the future.

## 2023-03-11 NOTE — Assessment & Plan Note (Signed)
4 months of sobriety, attending AA daily.  Really doing quite well. - Continue to monitor.

## 2023-03-11 NOTE — Assessment & Plan Note (Signed)
She reports bilateral knee pain started a few weeks ago and is actually been improving.  It is mostly resolved now.  She is wants to make sure there is not anything dangerous.  There is a very small nodular swelling, nontender, above the tibial border on L, possible inflammation.  Exam otherwise unremarkable.  No indication for imaging today, she has full range of motion, strength, very little pain.  She is too young for me to strongly suspect arthritis, although it is possible.  Could also be patellofemoral pain syndrome.  Discussed exercise with her.  She will follow-up as needed for this issue.

## 2023-03-11 NOTE — Patient Instructions (Signed)
Tanya Goodman,  Congratulations on ffour months of sobriety. This is an excellent accomplishment and you should be proud of it.  You can stop your B vitamins (folate and thiamine). Continue your multivitamin.  I will check your cholesterol today. If high, I will call you. If normal, I will probably send a message on mychart.  Expect a phonecall regarding an appointment with OB/GYN.  Return in 6 months please for a checkup.

## 2023-03-11 NOTE — Progress Notes (Signed)
   CC: Checkup  HPI:  Tanya Goodman is a 37 y.o. female living with a history stated below and presents today for checkup. Please see problem based assessment and plan for additional details.  Past Medical History:  Diagnosis Date   Alcohol use disorder    Alcoholic hepatitis    SVD (spontaneous vaginal delivery)    x 3    Review of Systems: ROS negative except for what is noted on the assessment and plan.  Vitals:   03/11/23 1421  BP: (!) 113/58  Pulse: 87  Temp: 98.1 F (36.7 C)  TempSrc: Oral  SpO2: 99%  Weight: 157 lb 11.2 oz (71.5 kg)  Height: 5\' 4"  (1.626 m)    Physical Exam: Constitutional: well-appearing female sitting in chair, in no acute distress HENT: normocephalic atraumatic, mucous membranes moist Eyes: conjunctiva non-erythematous Cardiovascular: regular rate and rhythm, no m/r/g Pulmonary/Chest: normal work of breathing on room air, lungs clear to auscultation bilaterally Abdominal: soft, non-tender, non-distended MSK: normal bulk and tone. Knees are not tender on exam. Neurological: alert & oriented x 3, no focal deficit Skin: warm and dry Psych: normal mood and behavior   Assessment & Plan:   Patient seen with Dr. Sol Blazing  Alcoholic hepatitis Hospitalized over summer 2024 for this.  She is now at 4 months of sobriety from alcohol.  She is in touch with GI regarding her liver care.  Recently stopped her Lasix.  Remains on Aldactone.  Stable, continue with GI.  Next appointment is in February.  Alcohol use disorder 4 months of sobriety, attending AA daily.  Really doing quite well. - Continue to monitor.  Depression with anxiety Declined medicine at earlier visit.  Is speaking with our counselor beyond that, that is helping her greatly.  She feels very well, does not feel she needs medicines right now.  I agree.  Continue to monitor.  Tobacco use Would behove her to stop.  She was given patches in the past but did not pick them up.  She  wants to wait for now.  I think this is okay, canl revisit tobacco quitting next year.  This year she has been very successful with stopping alcohol, I think she can be similarly successful in the future.  Knee pain She reports bilateral knee pain started a few weeks ago and is actually been improving.  It is mostly resolved now.  She is wants to make sure there is not anything dangerous.  There is a very small nodular swelling, nontender, above the tibial border on L, possible inflammation.  Exam otherwise unremarkable.  No indication for imaging today, she has full range of motion, strength, very little pain.  She is too young for me to strongly suspect arthritis, although it is possible.  Could also be patellofemoral pain syndrome.  Discussed exercise with her.  She will follow-up as needed for this issue.  Health care maintenance Due for Pap smear.  Offered to perform today, also offered to refer her to a gynecologist if she would prefer that.  She would like the referral. - Refer to gynecology  Katheran James, D.O. [facility] Internal Medicine, PGY-1 Phone: [phone] Date 03/11/2023 Time 5:17 PM

## 2023-03-11 NOTE — Assessment & Plan Note (Signed)
Due for Pap smear.  Offered to perform today, also offered to refer her to a gynecologist if she would prefer that.  She would like the referral. - Refer to gynecology

## 2023-03-12 LAB — LIPID PANEL
Chol/HDL Ratio: 4.4 {ratio} (ref 0.0–4.4)
Cholesterol, Total: 244 mg/dL — ABNORMAL HIGH (ref 100–199)
HDL: 55 mg/dL (ref >39)
LDL Chol Calc (NIH): 169 mg/dL — ABNORMAL HIGH (ref 0–99)
Triglycerides: 111 mg/dL (ref 0–149)
VLDL Cholesterol Cal: 20 mg/dL (ref 5–40)

## 2023-03-12 NOTE — Progress Notes (Signed)
Internal Medicine Clinic Attending  I was physically present during the key portions of the resident provided service and participated in the medical decision making of patient's management care. I reviewed pertinent patient test results.  The assessment, diagnosis, and plan were formulated together and I agree with the documentation in the resident's note.  Lau, Grace, MD  

## 2023-03-13 ENCOUNTER — Ambulatory Visit (INDEPENDENT_AMBULATORY_CARE_PROVIDER_SITE_OTHER): Payer: Medicaid Other | Admitting: Licensed Clinical Social Worker

## 2023-03-13 DIAGNOSIS — F418 Other specified anxiety disorders: Secondary | ICD-10-CM

## 2023-03-13 NOTE — BH Specialist Note (Unsigned)
Integrated Behavioral Health Follow Up In-Person Visit  MRN: [id number] Name: Tanya Goodman  Number of Integrated Behavioral Health Clinician visits: Additional Visit  Session Start time: 1500   Session End time: 1600  Total time in minutes: 60   Types of Service: {CHL AMB TYPE OF SERVICE:[phone]}  Interpretor:{yes MW:[id number]} Interpretor Name and Language: ***  Subjective: ALAISIA Goodman is a 37 y.o. female accompanied by {Patient accompanied by:[id number]} Patient was referred by *** for ***. Patient reports the following symptoms/concerns: *** Duration of problem: ***; Severity of problem: {Mild/Moderate/Severe:20260}  Objective: Mood: {BHH MOOD:22306} and Affect: {BHH AFFECT:22307} Risk of harm to self or others: {CHL AMB BH Suicide Current Mental Status:21022748}  Life Context: Family and Social: *** School/Work: *** Self-Care: *** Life Changes: ***  Patient and/or Family's Strengths/Protective Factors: {CHL AMB BH PROTECTIVE FACTORS:([phone]}  Goals Addressed: Patient will:  Reduce symptoms of: {IBH Symptoms:21014056}   Increase knowledge and/or ability of: {IBH Patient Tools:21014057}   Demonstrate ability to: {IBH Goals:21014053}  Progress towards Goals: {CHL AMB BH PROGRESS TOWARDS GOALS:[phone]}  Interventions: Interventions utilized:  {IBH Interventions:21014054} Standardized Assessments completed: {IBH Screening Tools:21014051}  Patient and/or Family Response: ***  Patient Centered Plan: Patient is on the following Treatment Plan(s): *** Assessment: Patient currently experiencing ***.   Patient may benefit from ***.  Plan: Follow up with behavioral health clinician on : *** Behavioral recommendations: *** Referral(s): {IBH Referrals:21014055} "From scale of 1-10, how likely are you to follow plan?": ***  Tanya Goodman

## 2023-04-08 ENCOUNTER — Ambulatory Visit: Payer: Medicaid Other | Admitting: Licensed Clinical Social Worker

## 2023-04-08 DIAGNOSIS — F418 Other specified anxiety disorders: Secondary | ICD-10-CM

## 2023-04-08 NOTE — BH Specialist Note (Addendum)
 Integrated Behavioral Health via Telemedicine Visit  04/08/2023 FINNLEE GUARNIERI [id number]  Number of Integrated Behavioral Health Clinician visits: Additional Visit  Session Start time: 1400   Session End time: 1500  Total time in minutes: 60   Referring Provider: PCP Patient/Family location: [facility] Provider location: Office All persons participating in visit: [facility] and Patient Types of Service: Individual psychotherapy  I connected with Annabella JONELLE Point via telehealth ( Telephone)  and verified that I am speaking with the correct person using two identifiers. Discussed confidentiality: Yes   I discussed the limitations of telemedicine and the availability of in person appointments.  Discussed there is a possibility of technology failure and discussed alternative modes of communication if that failure occurs.  I discussed that engaging in this telemedicine visit, they consent to the provision of behavioral healthcare and the services will be billed under their insurance.  Patient and/or legal guardian expressed understanding and consented to Telemedicine visit: Yes   Presenting Concerns: Patient and/or family reports the following symptoms/concerns: On April 22, 2023, a telehealth session was conducted by [place] with the patient. Throughout the session, the patient expressed several significant life updates and emotional experiences. They reported that their car needs repairs, which has been a source of stress. The patient also shared that they are starting new jobs, indicating a positive step towards independence. However, they described their recent Christmas as busy, followed by a [employer] celebration that involved a prime rib dinner, highlighting both social engagement and the pressures that come with holiday festivities. The patient discussed feelings of loneliness that have persisted over the past few days. This sentiment underscores a critical area of focus in  therapy, as it relates to their overarching goal of living independently. The patient articulated a fear of learning positive coping skills, expressing nervousness about their newfound insights into adopting a more positive outlook on life. This apprehension suggests an internal conflict between wanting to change and the fear associated with that change. In terms of future plans, the patient mentioned aspirations to acquire a new car, which could enhance their independence and mobility. The session concluded with a discussion on strategies to address their feelings of loneliness and fears surrounding coping skills. The therapist emphasized the importance of gradual exposure to positive coping mechanisms and encouraged the patient to reflect on small successes in their journey towards independence. Future sessions will continue to explore these themes and support the patient in navigating their emotional landscape while fostering resilience.   Patient and/or Family's Strengths/Protective Factors: Social connections  Goals Addressed: Patient will:  Reduce symptoms of:  Substance abuse   Progress towards Goals: Ongoing  Interventions: Interventions utilized:  Behavioral Activation Standardized Assessments completed: Not Needed  Patient and/or Family Response: Patient agrees to ongoing services  Assessment: Patient currently experiencing Substance abuse.   Patient may benefit from Ongoing therapy.  Plan: Follow up with behavioral health clinician on : Patient will schedule within 30 days.   I discussed the assessment and treatment plan with the patient and/or parent/guardian. They were provided an opportunity to ask questions and all were answered. They agreed with the plan and demonstrated an understanding of the instructions.   They were advised to call back or seek an in-person evaluation if the symptoms worsen or if the condition fails to improve as anticipated.  Renda Pontes, MSW,  LCSW-A She/Her Behavioral Health Clinician [facility]  Internal Medicine Center Direct Dial:[phone]  Fax [phone] Main Office Phone: [phone] [address]  [address], [place], [place] [address] Website: [facility] Internal Medicine [facility]  [place], [place]  [facility]

## 2023-04-23 ENCOUNTER — Ambulatory Visit (INDEPENDENT_AMBULATORY_CARE_PROVIDER_SITE_OTHER): Payer: Medicaid Other | Admitting: Licensed Clinical Social Worker

## 2023-04-23 DIAGNOSIS — F418 Other specified anxiety disorders: Secondary | ICD-10-CM

## 2023-04-23 NOTE — BH Specialist Note (Signed)
Integrated Behavioral Health Follow Up In-Person Visit  MRN: [id number] Name: Tanya Goodman  Number of Integrated Behavioral Health Clinician visits: Additional Visit  Session Start time: 1330   Session End time: 1430  Total time in minutes: 60   Types of Service: Individual psychotherapy  Interpretor:No. Interpretor Name and Language: N/A  Subjective: Tanya Goodman is a 38 y.o. female   Patient was referred by PCP for Behavioral health. Patient reports the following symptoms/concerns: During today's in-person session, the patient discussed their current professional landscape and future aspirations. Currently engaged in multiple income streams, they are actively working as a Corporate treasurer, completing seasonal [employer] work, participating in [employer], and performing contractual assignments. The patient is exploring potential career transitions, showing interest in real estate, event planning, and sales representative roles, with specific consideration of positions at [employer] or [employer] that offer flexible scheduling. Beyond professional pursuits, the patient is simultaneously focusing on personal growth and stability. They are actively searching for a rental house in May, indicating a desire for more permanent living arrangements. A significant personal milestone is approaching, with the patient set to celebrate six months of sobriety next Friday--a testament to their commitment to personal wellness and recovery. The combination of career exploration, housing goals, and continued sobriety suggests a holistic approach to self-improvement and life management. The session revealed a forward-looking individual actively navigating professional opportunities while maintaining a structured approach to personal development. The patient's diverse work experiences and openness to new career paths demonstrate resilience and adaptability in their current life  stage.  Objective: Risk of harm to self or others: No plan to harm self or others    Patient and/or Family's Strengths/Protective Factors: Social connections  Goals Addressed: Patient will:  Reduce symptoms of: anxiety and depression    Progress towards Goals: Ongoing  Interventions: Interventions utilized:  CBT Cognitive Behavioral Therapy Standardized Assessments completed: Not Needed  Patient and/or Family Response: Patient agrees to services   Assessment: Patient currently experiencing Anxiety and Depression.   Patient may benefit from Ongoing OPT.  Plan: Follow up with behavioral health clinician on : within the next 30 days.  [name], MSW, LCSW-A She/Her Behavioral Health Clinician [facility]  Internal Medicine Center Direct Dial:[phone]  Fax [phone] Main Office Phone: [phone] [address] [place] [place], [place] [address] Website: [facility] Internal Medicine [facility]  [place], [place]  [place]

## 2023-04-30 ENCOUNTER — Encounter: Payer: Self-pay | Admitting: Nurse Practitioner

## 2023-04-30 ENCOUNTER — Ambulatory Visit (INDEPENDENT_AMBULATORY_CARE_PROVIDER_SITE_OTHER): Payer: Medicaid Other | Admitting: Nurse Practitioner

## 2023-04-30 ENCOUNTER — Other Ambulatory Visit (INDEPENDENT_AMBULATORY_CARE_PROVIDER_SITE_OTHER): Payer: Medicaid Other

## 2023-04-30 VITALS — BP 104/66 | HR 96 | Ht 64.0 in | Wt 156.0 lb

## 2023-04-30 DIAGNOSIS — E538 Deficiency of other specified B group vitamins: Secondary | ICD-10-CM | POA: Diagnosis not present

## 2023-04-30 DIAGNOSIS — K7011 Alcoholic hepatitis with ascites: Secondary | ICD-10-CM

## 2023-04-30 DIAGNOSIS — K7010 Alcoholic hepatitis without ascites: Secondary | ICD-10-CM

## 2023-04-30 DIAGNOSIS — R760 Raised antibody titer: Secondary | ICD-10-CM

## 2023-04-30 DIAGNOSIS — F1020 Alcohol dependence, uncomplicated: Secondary | ICD-10-CM

## 2023-04-30 LAB — CBC
HCT: 39.9 % (ref 36.0–46.0)
Hemoglobin: 13.2 g/dL (ref 12.0–15.0)
MCHC: 33.1 g/dL (ref 30.0–36.0)
MCV: 90.6 fL (ref 78.0–100.0)
Platelets: 318 10*3/uL (ref 150.0–400.0)
RBC: 4.4 Mil/uL (ref 3.87–5.11)
RDW: 13.3 % (ref 11.5–15.5)
WBC: 9.6 10*3/uL (ref 4.0–10.5)

## 2023-04-30 LAB — COMPREHENSIVE METABOLIC PANEL
ALT: 16 U/L (ref 0–35)
AST: 18 U/L (ref 0–37)
Albumin: 4.2 g/dL (ref 3.5–5.2)
Alkaline Phosphatase: 73 U/L (ref 39–117)
BUN: 15 mg/dL (ref 6–23)
CO2: 26 meq/L (ref 19–32)
Calcium: 9.5 mg/dL (ref 8.4–10.5)
Chloride: 103 meq/L (ref 96–112)
Creatinine, Ser: 0.6 mg/dL (ref 0.40–1.20)
GFR: 115.42 mL/min (ref >60.00)
Glucose, Bld: 83 mg/dL (ref 70–99)
Potassium: 3.8 meq/L (ref 3.5–5.1)
Sodium: 136 meq/L (ref 135–145)
Total Bilirubin: 0.3 mg/dL (ref 0.2–1.2)
Total Protein: 7 g/dL (ref 6.0–8.3)

## 2023-04-30 LAB — FOLATE: Folate: >25.2 ng/mL (ref >5.9)

## 2023-04-30 LAB — PROTIME-INR
INR: 1.2 {ratio} — ABNORMAL HIGH (ref 0.8–1.0)
Prothrombin Time: 12.8 s (ref 9.6–13.1)

## 2023-04-30 NOTE — Progress Notes (Addendum)
ASSESSMENT    Brief Narrative:  38 y.o.  female known to Dr. Leone Payor with a past medical history not limited to Etoh abuse, Etoh hepatitis  Alcoholic hepatitis July 2024 treated with steroids and resolved. 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   Positive ANA   See PMH for any additional medical & surgical history   PLAN   --Will repeat labs since it has been three months. Obtain INR, CMET, CBC --If liver tests normal then probably see again in 6-12 months with reimaging of liver ?   --She understands importance of indefinite Etoh abstinence. Continue AA --Will recheck folate level realizing that a normal result doesn't rule out deficiency but less likely with improved nutrition and Etoh abstinence for nearly 6 months now.     HPI   Chief complaint : follow up on alcoholic liver disease  Brief History:  Tanya Goodman was hospitalized July 2024 with alcoholic hepatitis with ascites.   SAAG could not be calculated based on low albumin but other parameters suggested that ascites was related to portal hypertension. Based on Madrey's Discriminant Function she was started on prednisolone.  Severe hepatic steatosis seen on CT scan.  She remained in the hospital for several days, started on diuretics . She had an elevated INR which improved with vitamin K.  Lactulose was elevated  She was started on Lactulose  which was subsequently discontinued due to excessive bowel movements and absence of overt encephalopathy. Her LFTs pretty much normalized with steroids. Hepatic serologic evaluation was negative except for a positive ANA.   Tanya Goodman followed up in the office with Tanya Servant, PA on 01/23/2023, please refer to that note for details.  In summary, she was abstinent from alcohol . Follow-up labs remarkable for totally normal liver chemistries.  INR was still mildly elevated at 1.3.  We stopped  diuretics  Interval History:  Tanya Goodman is here for follow-up. Still abstinent from etoh. Involved in AA. Looks and feels well. Appetite is good, weight is stable. BMs normal. No blood in stool. No abdominal pain.  She does inquire about menses. When ill with Etoh hepatitis she wasn't having cycles. Menses resumed but then no cycle November or December. Not sexually active since May and she did have a cycle this month. I asked her to establish care with a GYN  GI History / Pertinent GI Studies   **All endoscopic studies may not be included here    Liver Evaluation / Management Hepatic serologic evaluation:   Ceruloplasmin,  Alpha-1 Antitrypsin, AMA, ASMA were all normal.   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 Cytology negative      Latest Ref Rng & Units 01/23/2023    9:17 AM 12/23/2022   12:45 PM 12/03/2022   12:26 PM  Hepatic Function  Total Protein 6.0 - 8.3 g/dL 7.6  7.4  7.2   Albumin 3.5 - 5.2 g/dL 4.2  4.0  3.9   AST 0 - 37 U/L 26  31  81   ALT 0 -  35 U/L 20  37  88   Alk Phosphatase 39 - 117 U/L 67  68  81   Total Bilirubin 0.2 - 1.2 mg/dL 0.6  1.0  2.7        Latest Ref Rng & Units 01/23/2023    9:17 AM 12/03/2022   12:26 PM 11/13/2022   10:35 AM  CBC  WBC 4.0 - 10.5 K/uL 9.0  8.7  7.6   Hemoglobin 12.0 - 15.0 g/dL 16.1  09.6  04.5   Hematocrit 36.0 - 46.0 % 43.9  40.3  34.1   Platelets 150.0 - 400.0 K/uL 361.0  340.0  320.0      Past Medical History:  Diagnosis Date   Alcohol use disorder    Alcoholic hepatitis    SVD (spontaneous vaginal delivery)    x 3    Past Surgical History:  Procedure Laterality Date   IR PARACENTESIS  10/30/2022   NO PAST SURGERIES      Family History   Problem Relation Age of Onset   Colon cancer Maternal Grandmother    Liver disease Neg Hx    Alcohol abuse Neg Hx     Current Medications, Allergies, Family History and Social History were reviewed in [employer] record.     Current Outpatient Medications  Medication Sig Dispense Refill   hydrOXYzine (VISTARIL) 25 MG capsule Take 1 capsule (25 mg total) by mouth 3 (three) times daily as needed for anxiety. (Patient taking differently: Take 25 mg by mouth as needed for anxiety.) 390 capsule 1   Multiple Vitamin (MULTIVITAMIN WITH MINERALS) TABS tablet Take 1 tablet by mouth daily.     folic acid (FOLVITE) 1 MG tablet Take 1 tablet (1 mg total) by mouth daily. (Patient not taking: Reported on 04/30/2023) 30 tablet 2   melatonin 5 MG TABS Take 1 tablet (5 mg total) by mouth at bedtime. (Patient not taking: Reported on 04/30/2023) 30 tablet 0   No current facility-administered medications for this visit.    Review of Systems: No chest pain. No shortness of breath. No urinary complaints.    Physical Exam  Filed Weights   04/30/23 1440  Weight: 156 lb (70.8 kg)   Wt Readings from Last 3 Encounters:  04/30/23 156 lb (70.8 kg)  03/11/23 157 lb 11.2 oz (71.5 kg)  01/23/23 153 lb (69.4 kg)    Ht 5\' 4"  (1.626 m)   Wt 156 lb (70.8 kg)   BMI 26.78 kg/m  Constitutional:  Pleasant, generally well appearing female in no acute distress. Psychiatric: Normal mood and affect. Behavior is normal. EENT: Pupils normal.  Conjunctivae are normal. No scleral icterus. Neck supple.  Cardiovascular: Normal rate, regular rhythm.  Pulmonary/chest: Effort normal and breath sounds normal. No wheezing, rales or rhonchi. Abdominal: Soft, nondistended, nontender. Bowel sounds active throughout. There are no masses palpable. No hepatomegaly. Neurological: Alert and oriented to person place and time.    Willette Cluster, NP  04/30/2023, 2:48 PM  Cc:  Faith Rogue,  DO

## 2023-04-30 NOTE — Patient Instructions (Addendum)
Your provider has requested that you go to the basement level for lab work before leaving today. Press "B" on the elevator. The lab is located at the first door on the left as you exit the elevator.  _______________________________________________________  If your blood pressure at your visit was 140/90 or greater, please contact your primary care physician to follow up on this.  _______________________________________________________  If you are age 37 or older, your body mass index should be between 23-30. Your Body mass index is 26.78 kg/m. If this is out of the aforementioned range listed, please consider follow up with your Primary Care Provider.  If you are age 21 or younger, your body mass index should be between 19-25. Your Body mass index is 26.78 kg/m. If this is out of the aformentioned range listed, please consider follow up with your Primary Care Provider.   ________________________________________________________  The [facility] providers would like to encourage you to use [facility] to communicate with providers for non-urgent requests or questions.  Due to long hold times on the telephone, sending your provider a message by [facility] may be a faster and more efficient way to get a response.  Please allow 48 business hours for a response.  Please remember that this is for non-urgent requests.  _______________________________________________________   Due to recent changes in healthcare laws, you may see the results of your imaging and laboratory studies on MyChart before your provider has had a chance to review them.  We understand that in some cases there may be results that are confusing or concerning to you. Not all laboratory results come back in the same time frame and the provider may be waiting for multiple results in order to interpret others.  Please give [place] 48 hours in order for your provider to thoroughly review all the results before contacting the office for clarification  of your results.   Thank you for entrusting me with your care and choosing [facility].  Willette Cluster NP

## 2023-05-15 ENCOUNTER — Ambulatory Visit: Payer: Medicaid Other | Admitting: Licensed Clinical Social Worker

## 2023-05-15 DIAGNOSIS — F418 Other specified anxiety disorders: Secondary | ICD-10-CM

## 2023-05-15 NOTE — BH Specialist Note (Signed)
Integrated Behavioral Health Follow Up In-Person Visit  MRN: [id number] Name: Tanya Goodman  Number of Integrated Behavioral Health Clinician visits: Additional Visit  Session Start time: 1330   Session End time: 1430  Total time in minutes: 60   Types of Service: Individual psychotherapy and General Behavioral Integrated Care (BHI)    Subjective: Tanya Goodman is a 38 y.o. female  Patient was referred by PCP for [facility].  Patient reports the following symptoms/concerns: During the session, the patient expressed feelings of being loved and wanted by her family, indicating an improvement in her relationships and self-esteem. She shared her enjoyment of feeling appreciated, which suggests a positive shift in her emotional well-being and social connections. These developments are encouraging signs of the patient's progress in recovery and overall mental health. The patient is currently navigating a housing transition, as she has been asked to move out of her current roommate situation. This change, while potentially stressful, is being approached as an opportunity for growth. The [facility] and patient discussed framing this as the start of a new chapter, with the patient moving back in with her mother. This transition is viewed as a chance for the patient to establish herself as a new individual in recovery. The patient plans to organize her new living space to ensure comfort and stability, demonstrating proactive problem-solving skills. The patient is expected to leave her roommate's home in May. The clinical note emphasizes the patient's remarkable growth and perseverance throughout her recovery journey. Her consistent adherence to the treatment plan and the notable progress she has made are commendable. The patient's understanding that living sober requires change shows insight and commitment to her recovery process. Her continued engagement in work, cooking, and attending meetings further  illustrates her dedication to maintaining a balanced and healthy lifestyle in sobriety. Overall, this clinical note reflects positively on the patient's recovery journey, highlighting her achievements, growing support system, and proactive approach to life changes. The patient's progress in maintaining sobriety, improving family relationships, and adapting to new circumstances demonstrates significant therapeutic gains and a strong foundation for continued growth.   Objective: Mood: NA and Affect: Tearful Risk of harm to self or others: No plan to harm self or others    Patient and/or Family's Strengths/Protective Factors: Social connections  Goals Addressed: Patient will:  Reduce symptoms of: depression  Progress towards Goals: Ongoing  Interventions: Interventions utilized:  Mindfulness or Management consultant and CBT Cognitive Behavioral Therapy Standardized Assessments completed: Not Needed  Patient and/or Family Response: Patient continues benefiting from services  Patient Centered Plan: Patient is on the following Treatment Plan(s): OPT Assessment: Patient currently experiencing Depression.   Patient may benefit from Therapy.  Plan: Follow up with behavioral health clinician on : Patient scheduled for next visit  [name], MSW, LCSW-A She/Her Behavioral Health Clinician [facility]  Internal Medicine Center Direct Dial:[phone]  Fax [phone] Main Office Phone: [phone] [address] [place] [place], [place] [address] Website: [facility] Internal Medicine [facility]  [place], [place]  [place]

## 2023-06-05 ENCOUNTER — Other Ambulatory Visit: Payer: Self-pay

## 2023-06-05 ENCOUNTER — Other Ambulatory Visit (HOSPITAL_COMMUNITY)
Admission: RE | Admit: 2023-06-05 | Discharge: 2023-06-05 | Disposition: A | Discharge status: Home / Self Care | Source: Ambulatory Visit | Attending: Obstetrics & Gynecology | Admitting: Obstetrics & Gynecology

## 2023-06-05 ENCOUNTER — Ambulatory Visit: Payer: Medicaid Other | Admitting: Licensed Clinical Social Worker

## 2023-06-05 ENCOUNTER — Encounter: Payer: Self-pay | Admitting: Obstetrics & Gynecology

## 2023-06-05 ENCOUNTER — Ambulatory Visit: Payer: Medicaid Other | Admitting: Obstetrics & Gynecology

## 2023-06-05 VITALS — BP 117/74 | HR 81 | Wt 156.0 lb

## 2023-06-05 DIAGNOSIS — Z124 Encounter for screening for malignant neoplasm of cervix: Secondary | ICD-10-CM | POA: Diagnosis not present

## 2023-06-05 DIAGNOSIS — F418 Other specified anxiety disorders: Secondary | ICD-10-CM

## 2023-06-05 NOTE — BH Specialist Note (Signed)
 Integrated Behavioral Health Follow Up In-Person Visit  MRN: [id number] Name: Tanya Goodman  Number of Integrated Behavioral Health Clinician visits: Additional Visit  Session Start time: 1330   Session End time: 1430  Total time in minutes: 60   Types of Service: Individual psychotherapy and General Behavioral Integrated Care (BHI)   Subjective: Tanya Goodman is a 38 y.o. female   Patient was referred by PCP for [facility]. Patient reports the following symptoms/concerns: I am pleased to report that the patient has made significant progress since our last visit, demonstrating remarkable growth and a positive outlook on her future. She appeared happy and excited as she shared her recent achievements and goals.  One of the most notable developments is the patient's preparation for her upcoming move. She has been actively working on setting up her new home, a task she's undertaking with enthusiasm. It's particularly heartening to see that her mother is supportive and equally excited about this new chapter in the patient's life, indicating a strong family support system.  The patient's financial situation has shown considerable improvement. She reported paying her bills early, which suggests better financial management and reduced stress in this area. This progress is likely contributing to her overall sense of well-being and stability.  In terms of her daily life, the patient is engaging in positive activities that promote both physical and emotional health. She's cooking regularly and enjoying meals with her mother, fostering important family connections. Her job at [employer] has become a source of satisfaction, with the patient appreciating the flexibility, pay, and positive feedback she receives from customers. This professional success is undoubtedly boosting her self-esteem and sense of purpose.  The patient's commitment to her recovery is evident in her continued attendance at [employer]. She expressed that these meetings provide stress relief and comfort, highlighting the importance of her support network in maintaining her progress.  Given the numerous positive highlights and achievements the patient shared, I recommended that she start writing a daily highlight in her journal. This practice can help reinforce her positive experiences and maintain her momentum.  As her behavioral health clinician, I am deeply impressed with the patient's progress. 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 The patient is doing an excellent job in her recovery journey, and her current trajectory is very promising.\  The patient's overall progress is highly encouraging. Her improved finances, positive home life, job satisfaction, and continued engagement with support systems all point to significant strides in her recovery. The patient's happiness and excitement about her future are palpable, and I look forward to supporting her continued growth in our future sessions.   Objective: Mood: NA and Affect: Appropriate Risk of harm to self or others: No plan to harm self or others  Life Context: Family and Social: Mother and Brother School/Work: [employer] Self-Care: Patient will work to find something Life Changes: Moving in with mother.  Patient and/or Family's Strengths/Protective Factors: Social connections  Goals Addressed: Patient will:  Reduce symptoms of: depression   Progress towards Goals: Ongoing  Interventions: Interventions utilized:  CBT Cognitive Behavioral Therapy Standardized Assessments completed: Not Needed  Patient and/or Family Response: Agreed to therapy   Patient Centered Plan: Patient is on the following Treatment Plan(s): OPT Assessment: Patient currently experiencing Anxiety.   Patient may benefit from Ongoing therapy.  Plan: Follow up with behavioral health clinician on :  03/27 at 1:30 pm   [facility]  Allyne Gee, MSW, LCSW-A She/Her Behavioral Health Clinician [facility]  Internal Medicine Center Direct Dial:[phone]  Fax [phone] Main Office Phone: [phone] [address] [place] [place], [place] [address] Website: [facility] Internal Medicine [facility]  [place], [place]  [place]

## 2023-06-05 NOTE — Progress Notes (Signed)
 GYNECOLOGY CLINIC ANNUAL PREVENTATIVE CARE ENCOUNTER NOTE  Subjective:   Tanya Goodman is a 38 y.o. [phone] female here for a routine annual gynecologic exam.  Current complaints: no birth control not sexually active.   Denies abnormal vaginal bleeding, discharge, pelvic pain, problems with intercourse or other gynecologic concerns.    Gynecologic History Patient's last menstrual period was 05/12/2023. Contraception: none Last Pap: 15 years. Results were: normal Last mammogram: n/a.  Obstetric History OB History  Gravida Para Term Preterm AB Living  4 1 1  3 1   SAB IAB Ectopic Multiple Live Births          # Outcome Date GA Lbr Len/2nd Weight Sex Type Anes PTL Lv  4 Term      Vag-Spont     3 AB           2 AB           1 AB             Past Medical History:  Diagnosis Date   Alcohol use disorder    Alcoholic hepatitis    SVD (spontaneous vaginal delivery)    x 3    Past Surgical History:  Procedure Laterality Date   IR PARACENTESIS  10/30/2022   NO PAST SURGERIES      Current Outpatient Medications on File Prior to Visit  Medication Sig Dispense Refill   hydrOXYzine (VISTARIL) 25 MG capsule Take 1 capsule (25 mg total) by mouth 3 (three) times daily as needed for anxiety. (Patient taking differently: Take 25 mg by mouth as needed for anxiety.) 390 capsule 1   Multiple Vitamin (MULTIVITAMIN WITH MINERALS) TABS tablet Take 1 tablet by mouth daily.     folic acid (FOLVITE) 1 MG tablet Take 1 tablet (1 mg total) by mouth daily. (Patient not taking: Reported on 06/05/2023) 30 tablet 2   melatonin 5 MG TABS Take 1 tablet (5 mg total) by mouth at bedtime. (Patient not taking: Reported on 04/30/2023) 30 tablet 0   No current facility-administered medications on file prior to visit.    No Known Allergies  Social History   Socioeconomic History   Marital status: Single    Spouse name: Not on file   Number of children: 1   Years of education: Not on file   Highest  education level: Not on file  Occupational History   Occupation: bartender  Tobacco Use   Smoking status: Every Day    Current packs/day: 1.00    Average packs/day: 1 pack/day for 17.6 years (17.6 ttl pk-yrs)    Types: Cigarettes    Start date: 10/29/2005   Smokeless tobacco: Never   Tobacco comments:    1/2 to a pack per day   Vaping Use   Vaping status: Never Used  Substance and Sexual Activity   Alcohol use: Not Currently    Comment: pt states "probably too much"   Drug use: Not Currently    Types: Marijuana, Benzodiazepines    Comment: rare   Sexual activity: Not Currently  Other Topics Concern   Not on file  Social History Narrative   Not on file   Social Drivers of Health   Financial Resource Strain: Not on file  Food Insecurity: Food Insecurity Present (11/02/2022)   Hunger Vital Sign    Worried About Running Out of Food in the Last Year: Sometimes true    Ran Out of Food in the Last Year: Sometimes true  Transportation Needs: No Transportation  Needs (10/30/2022)   PRAPARE - Administrator, Civil Service (Medical): No    Lack of Transportation (Non-Medical): No  Physical Activity: Not on file  Stress: Not on file  Social Connections: Socially Isolated (11/18/2022)   Social Connection and Isolation Panel [NHANES]    Frequency of Communication with Friends and Family: More than three times a week    Frequency of Social Gatherings with Friends and Family: More than three times a week    Attends Religious Services: Never    Database administrator or Organizations: No    Attends Banker Meetings: Never    Marital Status: Never married  Intimate Partner Violence: Not At Risk (10/30/2022)   Humiliation, Afraid, Rape, and Kick questionnaire    Fear of Current or Ex-Partner: No    Emotionally Abused: No    Physically Abused: No    Sexually Abused: No    Family History  Problem Relation Age of Onset   Colon cancer Maternal Grandmother    Liver  disease Neg Hx    Alcohol abuse Neg Hx     The following portions of the patient's history were reviewed and updated as appropriate: allergies, current medications, past family history, past medical history, past social history, past surgical history and problem list.  Review of Systems Pertinent items are noted in HPI.   Objective:  BP 117/74   Pulse 81   Wt 156 lb (70.8 kg)   LMP 05/12/2023   BMI 26.78 kg/m  CONSTITUTIONAL: Well-developed, well-nourished female in no acute distress.  HENT:  Normocephalic, atraumatic, External right and left ear normal. Oropharynx is clear and moist EYES: Conjunctivae and EOM are normal. Pupils are equal, round, and reactive to light. No scleral icterus.  NECK: Normal range of motion, supple, no masses.  Normal thyroid.  SKIN: Skin is warm and dry. No rash noted. Not diaphoretic. No erythema. No pallor. NEUROLGIC: Alert and oriented to person, place, and time. Normal reflexes, muscle tone coordination. No cranial nerve deficit noted. PSYCHIATRIC: Normal mood and affect. Normal behavior. Normal judgment and thought content. CARDIOVASCULAR: Normal heart rate noted, regular rhythm RESPIRATORY: Effort normal, no problems with respiration noted. ABDOMEN: Soft, normal bowel sounds, no distention noted.  No tenderness, rebound or guarding.  PELVIC: Normal appearing external genitalia; normal appearing vaginal mucosa and cervix.  No abnormal discharge noted.  Pap smear obtained.  Normal uterine size, no other palpable masses, no uterine or adnexal tenderness. MUSCULOSKELETAL: Normal range of motion. No tenderness.  No cyanosis, clubbing, or edema.    Assessment:  Annual gynecologic examination with pap smear   Plan:  Will follow up results of pap smear and manage accordingly. Routine preventative health maintenance measures emphasized. Please refer to After Visit Summary for other counseling recommendations.    Scheryl Darter, MD Attending Obstetrician  & Gynecologist Center for [employer], [facility]

## 2023-06-11 LAB — CYTOLOGY - PAP
Comment: NEGATIVE
Comment: NEGATIVE
Comment: NEGATIVE
Diagnosis: UNDETERMINED — AB
HPV 16: NEGATIVE
HPV 18 / 45: NEGATIVE
High risk HPV: POSITIVE — AB

## 2023-06-16 ENCOUNTER — Ambulatory Visit: Payer: Self-pay | Admitting: Student

## 2023-06-23 ENCOUNTER — Encounter: Payer: Self-pay | Admitting: Obstetrics & Gynecology

## 2023-06-23 ENCOUNTER — Telehealth: Payer: Self-pay

## 2023-06-23 NOTE — Telephone Encounter (Signed)
 Patient returned call and left VM this afternoon. Needs call back.

## 2023-06-23 NOTE — Telephone Encounter (Addendum)
-----   Message from Scheryl Darter sent at 06/23/2023  9:36 AM EDT ----- Needs colposcopy for ASCUS pos HR HPV   Called pt; VM left stating I am calling with results and provider recommendation for follow up appt. Callback number given. Will attempt to contact patient a second time.

## 2023-06-23 NOTE — Progress Notes (Signed)
 Needs colposcopy for ASCUS pos HR HPV

## 2023-06-25 NOTE — Telephone Encounter (Signed)
 Pt notified of results.  Pt agreed to receiving appt with Debroah Loop via MyChart.  Pt verbalized understanding.   Addison Naegeli, RN

## 2023-06-26 ENCOUNTER — Ambulatory Visit (INDEPENDENT_AMBULATORY_CARE_PROVIDER_SITE_OTHER): Admitting: Licensed Clinical Social Worker

## 2023-06-26 DIAGNOSIS — F418 Other specified anxiety disorders: Secondary | ICD-10-CM

## 2023-06-26 NOTE — BH Specialist Note (Signed)
 Integrated Behavioral Health Follow Up In-Person Visit  MRN: [id number] Name: Tanya Goodman  Number of Integrated Behavioral Health Clinician visits: Additional Visit  Session Start time: 1330   Session End time: 1430  Total time in minutes: 60   Types of Service: Individual psychotherapy and Health & Behavioral Assessment/Intervention   Subjective: Tanya Goodman is a 38 y.o. female Patient was referred by PCP for [facility]. Patient reports the following symptoms/concerns:   Patient presented for her session today in an enthusiastic and energetic state, displaying a positive and goal-oriented attitude. She reported successfully completing her task of cleaning her new living space while staying within her budget constraints. The patient expressed pride in accomplishing these tasks, which are moving her closer to her goal of relocating to live with her mother this weekend. During our discussion, she shared that she recently had dinner at [facility] with her brother, where she learned she will soon have a fifth nephew, news which she received with excitement. The patient continues to attend AA meetings regularly and mentioned her interest in potentially becoming a sponsor or support person for others in the future, demonstrating her commitment to her recovery journey and desire to help others.  The Behavioral Health Clinician [facility]) expressed pride in the patient's resilience and strong work ethic, recognizing how these qualities have contributed to her ability to meet her goals and make meaningful progress. The patient articulated several personal goals, including improving her credit score, purchasing a car, and establishing savings. We mutually agreed to update her treatment plan and goals in our next session to reflect her progress and evolving aspirations. She identified potential stressors related to conflict resolution and adjusting to living with her mother, particularly concerning how  to handle her mother's negative responses.   [facility] recommends coping skills she can practice, including deep breathing exercises, mindfulness techniques, setting healthy boundaries, using "I" statements during difficult conversations, taking time-outs when emotions escalate, journaling her thoughts and feelings, engaging in physical activity as a stress reliever, and reaching out to her support network when needed.  The patient has made significant progress in her treatment journey. Future sessions will explore several areas including relationships, career goals, her role as a mother to an adult daughter, and her interest in traveling. These topics will help support her continued growth and development of healthy coping mechanisms as she navigates these life transitions.    Objective: Mood:  Motivated  and Affect:  Positive Risk of harm to self or others: No plan to harm self or others  Life Context: Family and Social: Mother and Brother School/Work: [employer] Self-Care: Patient will work to find something Life Changes: Moving in with mother.   Patient and/or Family's Strengths/Protective Factors: Social connections   Goals Addressed: Patient will:  Reduce symptoms of: depression    Progress towards Goals: Ongoing   Interventions: Interventions utilized:  CBT Cognitive Behavioral Therapy Standardized Assessments completed: Not Needed   Patient and/or Family Response: Agreed to therapy    Patient Centered Plan: Patient is on the following Treatment Plan(s): OPT Assessment: Patient currently experiencing Anxiety.    Patient may benefit from Ongoing therapy.   Plan: Follow up with behavioral health clinician on : 04/24 at 1:30 pm    [name], MSW, LCSW-A She/Her Behavioral Health Clinician [facility]  Internal Medicine Center Direct [phone]  Fax [phone] Main Office Phone: [phone] [address] [place] [place], [place] [address] Website: [facility]  Internal Medicine [facility]  [place],  [place]  [place]

## 2023-06-26 NOTE — Patient Instructions (Addendum)
 Visit Information  Thank you for taking time to visit with me today. Please don't hesitate to contact me if I can be of assistance to you.   Following are the goals we discussed today:   The patient's primary goal is to transition successfully to living with her mother while maintaining emotional well-being. She will practice coping skills such as deep breathing, mindfulness, journaling, and setting healthy boundaries to manage potential conflicts. Physical activity will also be encouraged to reduce stress.  Maintaining sobriety and strengthening recovery is another key goal. The patient will continue attending AA meetings and explore becoming a sponsor or support person, reflecting on her readiness and sharing experiences with peers.  To improve financial stability, the patient will work on creating a budget, tracking spending, improving her credit score, and saving money to eventually purchase a car. These steps will support her long-term independence.  Activities include daily journaling to process emotions, practicing mindfulness exercises three times per week, and engaging in enjoyable activities like family time or hobbies. Progress will be reviewed in future sessions, with updates made as needed.  Next Appointment: April 24th at 1:30 PM (in person). Focus will include treatment plan updates and continued exploration of coping strategies.  [name], MSW, LCSW-A She/Her Behavioral Health Clinician [facility]  Internal Medicine Center Direct [phone]  Fax [phone] Main Office Phone: [phone] [address] [place] [place], [place] [address] Website: [facility] Internal Medicine [facility]  [place], [place]  [place]

## 2023-07-24 ENCOUNTER — Ambulatory Visit: Admitting: Licensed Clinical Social Worker

## 2023-07-24 DIAGNOSIS — F418 Other specified anxiety disorders: Secondary | ICD-10-CM

## 2023-07-24 NOTE — BH Specialist Note (Signed)
 Integrated Behavioral Health Follow Up In-Person Visit  MRN: [id number] Name: Tanya Goodman  Number of Integrated Behavioral Health Clinician visits: Additional Visit  Session Start time: 1330   Session End time: 1430  Total time in minutes: 60   Types of Service: Individual psychotherapy and General Behavioral Integrated Care (BHI)  Interpretor:No. Interpretor Name and Language: N/A  Subjective: Tanya Goodman is a 38 y.o. female  Patient was referred by PCP for Anxiety. Patient reports the following symptoms/concerns: An in-person therapy session was conducted today with the patient, who was present, alert, cooperative, and appeared to be in good spirits. 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 This led to a collaborative reflection on her accomplishment, highlighting themes of growth, consistency, and personal validation.  The patient continues to demonstrate significant growth and resilience in her recovery journey from alcohol use. Her ability to remain employed and sustain stability in her work life reflects a deepening sense of responsibility, self-worth, and commitment to change. She is actively building a life centered around positive growth and healthier choices. The patient has shown an increased capacity to identify triggers and process them effectively, which was evident in today's discussion around house sitting in an environment with alcohol present. We explored the potential risks involved and revisited the personal and emotional costs of alcohol use. She articulated the connection between high-stress environments--specifically a past job--and her previous drinking behavior, showing improved insight and self-awareness.  In the latter part of the session, we introduced and practiced a cognitive behavioral therapy (CBT) skill focused on gratitude. Additionally, a therapist-led  reflection on self-worth provided a powerful space for the patient to deepen her internal processing. She became tearful during this portion, demonstrating vulnerability and openness. However, she was able to employ strength-based practices to reframe negative thoughts, which is a clear indicator of her emotional resilience and progress.  The session concluded with the scheduling of a follow-up appointment on 05/05, to be held in person.   Objective: Mood: NA and Affect: Appropriate Risk of harm to self or others: No plan to harm self or others   Life Context: Family and Social: Mother and Brother School/Work: [employer] Self-Care: Patient will work to find something Life Changes: Moving in with mother.   Patient and/or Family's Strengths/Protective Factors: Social connections   Goals Addressed: Patient will:  Reduce symptoms of: depression    Progress towards Goals: Ongoing   Interventions: Interventions utilized:  CBT Cognitive Behavioral Therapy Standardized Assessments completed: Not Needed   Patient and/or Family Response: Agreed to therapy    Patient Centered Plan: Patient is on the following Treatment Plan(s): OPT Assessment: Patient currently experiencing Anxiety.    Patient may benefit from Ongoing therapy.   Plan: Follow up with behavioral health clinician on : May 5th in-person   Amie Bald, MSW, LCSW-A She/Her Behavioral Health Clinician [facility]  Internal Medicine Center Direct [phone]  Fax [phone] Main Office Phone: [phone] [address] [place] [place], [place] [address] Website: [facility] Internal Medicine [facility]  [place], [place]  [facility]

## 2023-07-28 ENCOUNTER — Telehealth: Payer: Self-pay | Admitting: Family Medicine

## 2023-07-28 NOTE — Telephone Encounter (Signed)
 I reached out to the patient regarding to reschedule her colpo with arnold, Informed patient our June calendar is not open yet. She will reach out to us  in a few weeks.

## 2023-07-31 ENCOUNTER — Ambulatory Visit: Admitting: Obstetrics & Gynecology

## 2023-08-04 ENCOUNTER — Ambulatory Visit: Admitting: Licensed Clinical Social Worker

## 2023-08-04 DIAGNOSIS — F418 Other specified anxiety disorders: Secondary | ICD-10-CM | POA: Diagnosis not present

## 2023-08-04 NOTE — BH Specialist Note (Signed)
 Integrated Behavioral Health via Telemedicine Visit  08/04/2023 Tanya Goodman [id number]  Number of Integrated Behavioral Health Clinician visits: Additional Visit  Session Start time: 1330   Session End time: 1430  Total time in minutes: 60   Referring Provider: PCP Patient/Family location: Home [facility] Provider location: Office All persons participating in visit: Patient and [facility] Types of Service: Telephone visit and Health & Behavioral Assessment/Intervention  I connected with Tanya Goodman via Telephone Telemedicine Application  and verified that I am speaking with the correct person using two identifiers. Discussed confidentiality: Yes   I discussed the limitations of telemedicine and the availability of in person appointments.  Discussed there is a possibility of technology failure and discussed alternative modes of communication if that failure occurs.  I discussed that engaging in this telemedicine visit, they consent to the provision of behavioral healthcare and the services will be billed under their insurance.  Patient and/or legal guardian expressed understanding and consented to Telemedicine visit: Yes   Presenting Concerns: Patient and/or family reports the following symptoms/concerns: Patient was seen via telehealth (telephone) using a HIPAA-compliant platform after requesting to switch from an in-person session due to transportation barriers. She was enthusiastic, happy, humorous, and in good spirits throughout the session. The patient reported several recent accomplishments, including increased face-to-face participation in AA meetings and sharing her coping strategies with other group members. She was particularly proud of inspiring her Toy Freund to complete her GED; Toy Freund now plans to attend college.   The patient identified a new personal goal to lose 15 pounds, having noticed weight gain since moving into her mother's home. She plans to address this by walking more  and adopting a healthier diet, starting with a grocery shopping trip later today. [facility] provided information about Abe Abed, the in-house nutritionist; however, the patient declined a nutrition appointment at this time but stated she may consider it in the future.   The conversation also included a brief discussion of her Medicaid benefits, with a focus on dental and vision services. The patient plans to look into scheduling a dental appointment soon. 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   The session concluded with scheduling the next appointment for 05/22 at 1:30 PM, in person at the new building. The patient acknowledged and understood the updated location.   Patient and/or Family's Strengths/Protective Factors: Sense of purpose  Goals Addressed: Patient will:  Reduce symptoms of: depression    Progress towards Goals: Ongoing  Interventions: Interventions utilized:  Supportive Counseling Standardized Assessments completed: Not Needed  Patient and/or Family Response: Patient agrees to ongoing OPT  Assessment: Patient currently experiencing Transportation barriers.    Patient may benefit from Ongoing OPT.  Plan: Follow up with behavioral health clinician on : 08/21/2023 at 1:30 pm in person    I discussed the assessment and treatment plan with the patient and/or parent/guardian. They were provided an opportunity to ask questions and all were answered. They agreed with the plan and demonstrated an understanding of the instructions.   They were advised to call back or seek an in-person evaluation if the symptoms worsen or if the condition fails to improve as anticipated.  Amie Bald, MSW, LCSW-A She/Her Behavioral Health Clinician [facility]  Internal Medicine Center Direct Dial:[phone]  Fax [phone] Main Office Phone:  [phone] [address] [place] [place], [place] [address] Website: [facility]  Primary Care  [place], [place]  [place]

## 2023-08-21 ENCOUNTER — Ambulatory Visit (INDEPENDENT_AMBULATORY_CARE_PROVIDER_SITE_OTHER): Admitting: Licensed Clinical Social Worker

## 2023-08-21 DIAGNOSIS — F418 Other specified anxiety disorders: Secondary | ICD-10-CM | POA: Diagnosis not present

## 2023-08-21 NOTE — BH Specialist Note (Signed)
 Integrated Behavioral Health Follow Up In-Person Visit  MRN: [id number] Name: Tanya Goodman  Number of Integrated Behavioral Health Clinician visits: Additional Visit  Session Start time: 1330   Session End time: 1430  Total time in minutes: 60   Types of Service: Health & Behavioral Assessment/Intervention  Interpretor:No. Interpretor Name and Language: N/A  Subjective: Tanya Goodman is a 38 y.o. female  Patient was referred by PCP for Anxiety. Patient reports the following symptoms/concerns: An in-person therapy session was conducted today with the patient, who presented as alert, cooperative, and in good spirits. The patient shared several positive developments in her life, including the acquisition of a new car, travel plans with her daughter, and the commencement of work with Tanya Goodman. She also shared her daughter's upcoming start at college and reflected on the milestone of being in remission from alcohol use. The patient expressed pride in her progress and noted that, for the first time, she feels as though life is ahead of her. She is approaching her one-year anniversary of remission on August 1st and has set a goal to reduce her nicotine  use following this milestone.  Objective: Mood: NA and Affect: Appropriate Risk of harm to self or others: No plan to harm self or others   Life Context: Family and Social: Mother and Brother School/Work: [employer] Self-Care: Patient will work to find something Life Changes: Moving in with mother.   Patient and/or Family's Strengths/Protective Factors: Social connections   Goals Addressed: Patient will:  Reduce symptoms of: Anxiety   Progress towards Goals: Ongoing   Interventions: Interventions utilized:  Supportive Counseling Standardized Assessments completed: Not Needed   Patient and/or Family Response: Agreed to therapy    Patient Centered Plan: Patient is on the following Treatment Plan(s):  OPT Assessment: Patient currently experiencing Anxiety.    Patient may benefit from Ongoing therapy.   Plan: Follow up with behavioral health clinician on : June 4th telehealth and June 19 th in-person   Amie Bald, MSW, LCSW-A She/Her Behavioral Health Clinician [facility]  Internal Medicine Center Direct [phone]  Fax [phone] Main Office Phone: [phone] [address] [place] [place], [place] [address] Website: [facility] Internal Medicine [facility]  [place], [place]  [place]

## 2023-09-03 ENCOUNTER — Ambulatory Visit (INDEPENDENT_AMBULATORY_CARE_PROVIDER_SITE_OTHER): Admitting: Licensed Clinical Social Worker

## 2023-09-03 DIAGNOSIS — F418 Other specified anxiety disorders: Secondary | ICD-10-CM

## 2023-09-03 NOTE — BH Specialist Note (Signed)
 Integrated Behavioral Health via Telemedicine Visit  09/03/2023 Tanya Goodman [id number]  Number of Integrated Behavioral Health Clinician visits: Additional Visit  Session Start time: 1330   Session End time: 1400  Total time in minutes: 30    Referring Provider: PCP Patient/Family location: Home [facility] Provider location: Office All persons participating in visit: [facility] and Patient Types of Service: Telephone visit  I connected with Tanya Goodman via  Telephone  and verified that I am speaking with the correct person using two identifiers. Discussed confidentiality: Yes   I discussed the limitations of telemedicine and the availability of in person appointments.  Discussed there is a possibility of technology failure and discussed alternative modes of communication if that failure occurs.  I discussed that engaging in this telemedicine visit, they consent to the provision of behavioral healthcare and the services will be billed under their insurance.  Patient and/or legal guardian expressed understanding and consented to Telemedicine visit: Yes   Presenting Concerns: Patient and/or family reports the following symptoms/concerns: LCSW-A conducted a telehealth session on today. The patient presented with mixed emotions, expressing both excitement and anxiety as she prepares to spend quality time with her daughter, whom she has not seen in over a year. The patient reflected on their previous visit, during which she appeared physically unwell and reported feeling emotionally unstable. She expressed a sense of pride and accomplishment in her current progress, noting that she no longer "looks yellow" or presents in the same unhealthy state. The patient has demonstrated significant growth in her recovery and sobriety and is becoming increasingly self-aware of her perfectionistic tendencies. During the session, she acknowledged the impact of these thoughts and committed to practicing  reflection and cognitive restructuring in order to prevent self-defeating beliefs from interfering with her goals. The therapist validated her insight and encouraged continued use of mindfulness and self-compassion techniques to manage anticipatory anxiety and support sustained progress.  Patient expressed in the prior week having a short episodes of feeling down. Patient stated she was able to use her coping skills to address symptoms. Patient contributes sadness to weather change and unknown factors.   Patient and/or Family's Strengths/Protective Factors: Sense of purpose  Goals Addressed: Patient will:  Reduce symptoms of: depression   Increase knowledge and/or ability of: coping skills   Demonstrate ability to: Increase healthy adjustment to current life circumstances  Progress towards Goals: Ongoing    Interventions: Interventions utilized:  Supportive Counseling Standardized Assessments completed: Not Needed    Patient and/or Family Response: Patient agrees to ongoing counseling.  Clinical Assessment/Diagnosis  Depression with anxiety    Assessment: Patient currently experiencing Depression and Anxiety.   Patient may benefit from Ongoing Therapy..  Plan: Follow up with behavioral health clinician on : 06/19 at 1:30 pm; In-person   I discussed the assessment and treatment plan with the patient and/or parent/guardian. They were provided an opportunity to ask questions and all were answered. They agreed with the plan and demonstrated an understanding of the instructions.   They were advised to call back or seek an in-person evaluation if the symptoms worsen or if the condition fails to improve as anticipated.  Tanya Goodman, MSW, LCSW-A She/Her Behavioral Health Clinician [facility]  Internal Medicine Center Direct Dial:[phone]  Fax [phone] Main Office Phone: [phone] [address] [place] [place], [place] [address] Website: [facility] Internal Medicine  [facility]  [place], [place]  [place]

## 2023-09-18 ENCOUNTER — Ambulatory Visit: Admitting: Student

## 2023-09-18 ENCOUNTER — Ambulatory Visit (INDEPENDENT_AMBULATORY_CARE_PROVIDER_SITE_OTHER): Admitting: Licensed Clinical Social Worker

## 2023-09-18 VITALS — BP 121/70 | HR 82 | Temp 99.4°F | Ht 64.0 in | Wt 159.2 lb

## 2023-09-18 DIAGNOSIS — R87610 Atypical squamous cells of undetermined significance on cytologic smear of cervix (ASC-US): Secondary | ICD-10-CM

## 2023-09-18 DIAGNOSIS — F1090 Alcohol use, unspecified, uncomplicated: Secondary | ICD-10-CM

## 2023-09-18 DIAGNOSIS — G44219 Episodic tension-type headache, not intractable: Secondary | ICD-10-CM

## 2023-09-18 DIAGNOSIS — K7011 Alcoholic hepatitis with ascites: Secondary | ICD-10-CM

## 2023-09-18 DIAGNOSIS — R87810 Cervical high risk human papillomavirus (HPV) DNA test positive: Secondary | ICD-10-CM | POA: Diagnosis not present

## 2023-09-18 DIAGNOSIS — F17210 Nicotine dependence, cigarettes, uncomplicated: Secondary | ICD-10-CM | POA: Diagnosis not present

## 2023-09-18 DIAGNOSIS — F418 Other specified anxiety disorders: Secondary | ICD-10-CM

## 2023-09-18 DIAGNOSIS — Z720 Tobacco use: Secondary | ICD-10-CM

## 2023-09-18 NOTE — Assessment & Plan Note (Signed)
 Continues to follow-up with behavioral health here at the [facility] and is doing well with that.  She has declined medication in the past and due to her stability would like to continue off medication. - Continue with behavioral health counseling, next appointment this afternoon

## 2023-09-18 NOTE — Assessment & Plan Note (Signed)
 Hospitalized with alcoholic hepatitis in 2024 after which she has abstained from alcohol and has followed up with GI.  She was seen by GI in January 2025 with a repeat CBC, CMP, INR, and folate with the only abnormal value being an INR of 1.2.  At that point GI recommended a 83-month follow-up that has not yet been scheduled but she will call to schedule this.  She is doing a great job with abstinence from alcohol. - Continue with GI follow up

## 2023-09-18 NOTE — Assessment & Plan Note (Signed)
 Continues to smoke 1 pack/day and is interested in quitting but would like to complete 1 year of sobriety from alcohol before doing this.  She is not interested in starting Chantix mainly because she does not want to take daily medications.  She did well with nicotine  patches during her hospitalization however this was in the setting of acute hepatitis but she is likely going to try the patches and maybe gum when she does attempt to quit.  She is aware that she can call our clinic at any time to get prescriptions or to discuss this further.

## 2023-09-18 NOTE — Assessment & Plan Note (Signed)
 Patient describes about 2 weeks of near daily tension type headaches that coincided with recent family travel and stress.  However 1 of these headaches about 2 days ago was severe, lasted for hours, and had some associated palpitations.  She took ibuprofen 400 mg with complete resolution of her symptoms.  Discussed signs and symptoms to watch for such as recurrence of severe headaches, one-sided headaches, associated vision troubles, nausea, vomiting, and presyncope/syncope.  For now we will manage with over-the-counter medications as needed. - Tylenol  1000 mg every 8 hours as needed - Secondary option of ibuprofen 400 mg every 6 hours as needed

## 2023-09-18 NOTE — Assessment & Plan Note (Signed)
 Over 10 months of sobriety and continues to attend AA.

## 2023-09-18 NOTE — Assessment & Plan Note (Signed)
 Pap smear with OB/GYN on 06/05/2023 showed ASCUS with positive high risk HPV.  She has an appointment with OB/GYN on 10/01/2023 to perform colposcopy.  No abnormal uterine or vaginal bleeding reported by the patient.

## 2023-09-18 NOTE — Patient Instructions (Addendum)
 Thank you, [name], for allowing us  to provide your care today. Today we discussed . . .  > Headaches       - I do think your headaches are from your recent physical and emotional stresses with traveling and they sound consistent with tension headaches which are not dangerous.  However if you have any new symptoms or change in severity please let us  know right away.  For your headaches you can take Tylenol  1000 mg every 8 hours as needed, after that you can take ibuprofen 400 mg every 6 hours as needed.  You could also take Excedrin Migraine as instructed on the bottle but I would be careful not to mix this with the Tylenol  or ibuprofen because Excedrin has Tylenol  and aspirin (which is an NSAID) contained in it.  If these medications are not working to stop your headache or you are worried about the headaches further please let us  know and we can see you sooner to talk about further workup and other medications that could work.   Follow up: 6 months or sooner if you need anything before then   Remember:  Should you have any questions or concerns please call the internal medicine clinic at [phone].     Tanya Dallas, DO [facility] Internal Medicine Center

## 2023-09-18 NOTE — Progress Notes (Signed)
 CC: Routine Follow Up for alcohol use disorder, depression, and tobacco use disorder after last office visit 03/11/2023  HPI:  Tanya Goodman is a 38 y.o. female with pertinent PMH of AUD in remission with prior alcoholic hepatitis, depression, anxiety, TUD, and obesity who presents as above. Please see assessment and plan below for further details.  Medications: Current Outpatient Medications  Medication Instructions   hydrOXYzine  (VISTARIL ) 25 mg, Oral, 3 times daily PRN   Multiple Vitamin (MULTIVITAMIN WITH MINERALS) TABS tablet 1 tablet, Oral, Daily     Review of Systems:   Pertinent items noted in HPI and/or A&P.  Physical Exam:  Vitals:   09/18/23 1057  BP: 121/70  Pulse: 82  Temp: 99.4 F (37.4 C)  TempSrc: Oral  SpO2: 98%  Weight: 159 lb 3.2 oz (72.2 kg)  Height: 5' 4 (1.626 m)    Constitutional: Well-appearing adult female. In no acute distress. HEENT: Normocephalic, atraumatic, Sclera non-icteric, PERRL, EOM intact Cardio:Regular rate and rhythm. 2+ bilateral radial pulses. Pulm:Clear to auscultation bilaterally. Normal work of breathing on room air. RUE:[id number] for extremity edema. Skin:Warm and dry. Neuro:Alert and oriented x3. No focal deficit noted. Psych:Pleasant mood and affect.   Assessment & Plan:   Alcoholic hepatitis Hospitalized with alcoholic hepatitis in 2024 after which she has abstained from alcohol and has followed up with GI.  She was seen by GI in January 2025 with a repeat CBC, CMP, INR, and folate with the only abnormal value being an INR of 1.2.  At that point GI recommended a 59-month follow-up that has not yet been scheduled but she will call to schedule this.  She is doing a great job with abstinence from alcohol. - Continue with GI follow up  Alcohol use disorder Over 10 months of sobriety and continues to attend AA.  Depression with anxiety Continues to follow-up with behavioral health here at the [facility] and is doing well with  that.  She has declined medication in the past and due to her stability would like to continue off medication. - Continue with behavioral health counseling, next appointment this afternoon  Tobacco use Continues to smoke 1 pack/day and is interested in quitting but would like to complete 1 year of sobriety from alcohol before doing this.  She is not interested in starting Chantix mainly because she does not want to take daily medications.  She did well with nicotine  patches during her hospitalization however this was in the setting of acute hepatitis but she is likely going to try the patches and maybe gum when she does attempt to quit.  She is aware that she can call our clinic at any time to get prescriptions or to discuss this further.  ASCUS with positive high risk HPV cervical Pap smear with OB/GYN on 06/05/2023 showed ASCUS with positive high risk HPV.  She has an appointment with OB/GYN on 10/01/2023 to perform colposcopy.  No abnormal uterine or vaginal bleeding reported by the patient.  Episodic tension-type headache, not intractable Patient describes about 2 weeks of near daily tension type headaches that coincided with recent family travel and stress.  However 1 of these headaches about 2 days ago was severe, lasted for hours, and had some associated palpitations.  She took ibuprofen 400 mg with complete resolution of her symptoms.  Discussed signs and symptoms to watch for such as recurrence of severe headaches, one-sided headaches, associated vision troubles, nausea, vomiting, and presyncope/syncope.  For now we will manage with over-the-counter medications as  needed. - Tylenol  1000 mg every 8 hours as needed - Secondary option of ibuprofen 400 mg every 6 hours as needed    Patient discussed with Dr. Bevelyn Bryant  [place] Dallas, DO Internal Medicine Center Internal Medicine Resident PGY-2 Clinic Phone: [phone] Please contact the on call pager at [phone] for any urgent or  emergent needs.

## 2023-09-22 NOTE — Progress Notes (Signed)
 Internal Medicine Clinic Attending  Case discussed with the resident at the time of the visit.  We reviewed the resident's history and exam and pertinent patient test results.  I agree with the assessment, diagnosis, and plan of care documented in the resident's note.

## 2023-09-23 NOTE — BH Specialist Note (Signed)
 Integrated Behavioral Health Follow Up In-Person Visit  MRN: [id number] Name: Tanya Goodman  Number of Integrated Behavioral Health Clinician visits: Additional Visit  Session Start time: 1330   Session End time: 1430  Total time in minutes: 60    Types of Service: Individual psychotherapy  Interpretor:No. Interpretor Name and Language: N/A  Subjective: Tanya Goodman is a 38 y.o. female  Patient was referred by PCP for Counseling. Patient reports the following symptoms/concerns: LCSW-A conducted a session today. The patient presented as tearful and frustrated while processing emotions related to a recent family trip, which also included quality time spent individually with her daughter. She expressed challenges in abstaining from alcohol during emotionally charged moments but also highlighted her resilience and determination to maintain sobriety.  The session focused on reflecting on the duration of the trip, identifying emotional barriers, recognizing triggers, and discussing moments that could have been navigated more effectively through confident and direct communication. The patient demonstrated insight into her emotional responses and showed motivation to continue using healthy coping strategies.  The patient proudly shared that she remains sober as her one-year sobriety milestone approaches in August. She reported continued employment and expressed gratitude for her new vehicle, which has contributed to her sense of independence and stability. The patient acknowledged her progress and expressed a desire to no longer feel responsible for everyone else's needs, recognizing the importance of maintaining boundaries for her own well-being.  The session concluded with affirmations of her progress and the scheduling of the next session.   Duration of problem: Less than 2 weeks; Severity of problem: mild  Objective: Mood: Irritable and Affect: Tearful Risk of harm to self or  others: No plan to harm self or others  Life Context: Family and Social: Patient is a single mom to an adult daughter   Patient and/or Family's Strengths/Protective Factors: Sense of purpose  Goals Addressed: Patient will:  Reduce symptoms of: anxiety and depression   Progress towards Goals: Ongoing  Interventions: Interventions utilized:  Solution-Focused Strategies Standardized Assessments completed: Patient declined screening      Patient and/or Family Response: Patient agrees to ongoing services  Patient Centered Plan: Patient is on the following Treatment Plan(s): Bi weekly sessions with [facility]  Clinical Assessment/Diagnosis  Alcohol use disorder    Assessment: Patient currently experiencing adjustment disorder to becoming sober.   Patient may benefit from Ongoing AA meetings and individual counseling.  Plan: Follow up with behavioral health clinician on : 07/14 @ 1:30 pm  Renda Pontes, MSW, LCSW-A She/Her Behavioral Health Clinician [facility]  Internal Medicine Center

## 2023-10-01 ENCOUNTER — Other Ambulatory Visit: Payer: Self-pay

## 2023-10-01 ENCOUNTER — Other Ambulatory Visit (HOSPITAL_COMMUNITY)
Admission: RE | Admit: 2023-10-01 | Discharge: 2023-10-01 | Disposition: A | Discharge status: Home / Self Care | Source: Ambulatory Visit | Attending: Obstetrics and Gynecology | Admitting: Obstetrics and Gynecology

## 2023-10-01 ENCOUNTER — Encounter: Payer: Self-pay | Admitting: Obstetrics and Gynecology

## 2023-10-01 ENCOUNTER — Ambulatory Visit: Admitting: Obstetrics and Gynecology

## 2023-10-01 VITALS — BP 119/78 | HR 78 | Ht 64.0 in | Wt 161.2 lb

## 2023-10-01 DIAGNOSIS — R87810 Cervical high risk human papillomavirus (HPV) DNA test positive: Secondary | ICD-10-CM | POA: Diagnosis not present

## 2023-10-01 DIAGNOSIS — N870 Mild cervical dysplasia: Secondary | ICD-10-CM | POA: Diagnosis not present

## 2023-10-01 DIAGNOSIS — R87610 Atypical squamous cells of undetermined significance on cytologic smear of cervix (ASC-US): Secondary | ICD-10-CM | POA: Insufficient documentation

## 2023-10-01 DIAGNOSIS — Z3202 Encounter for pregnancy test, result negative: Secondary | ICD-10-CM | POA: Diagnosis not present

## 2023-10-01 DIAGNOSIS — N72 Inflammatory disease of cervix uteri: Secondary | ICD-10-CM | POA: Diagnosis not present

## 2023-10-01 LAB — POCT PREGNANCY, URINE: Preg Test, Ur: NEGATIVE

## 2023-10-01 NOTE — Progress Notes (Signed)
    GYNECOLOGY CLINIC COLPOSCOPY PROCEDURE NOTE  38 y.o. [id number] here for colposcopy for pap finding of:  Result Date Procedure Results Follow-ups  06/05/2023 Cytology - PAP( [place]) High risk HPV: Positive (A) HPV 16: Negative HPV 18 / 45: Negative Adequacy: Satisfactory for evaluation; transformation zone component PRESENT. Diagnosis: - Atypical squamous cells of undetermined significance (ASC-US ) (A) Comment: Normal Reference Range HPV - Negative Comment: Normal Reference Range HPV 16- Negative Comment: Normal Reference Range HPV 16 18 45 -Negative     Discussed role for HPV in cervical dysplasia, need for surveillance, nature of the procedure, and risks and benefits.  Pregnancy test: Lab Results  Component Value Date   PREGTESTUR NEGATIVE 10/01/2023    No Known Allergies  Patient given informed consent, signed copy in the chart, time out was performed.    Placed in lithotomy position. Cervix viewed with speculum and colposcope after application of acetic acid and lugol's solution.   Colposcopy Adequacy Cervix fully visualized: Yes  SCJ fully visualized: Yes  Colposcopy Findings visible lesion(s) at 11 o'clock only seen with Monsel's  Corresponding biopsies were obtained.    ECC specimen was not obtained.  All specimens were labeled and sent to pathology.  Hemostatic measures: Monsel's solution  Complications: none  Patient tolerated the procedure well.  OBGyn Exam  Colposcopy Impressions Low grade features  Plan Awaiting results of biopsy, likely repap in 1 year  Patient was given post procedure instructions.  Will follow up pathology and manage accordingly; patient will be contacted with results and recommendations.  Routine preventative health maintenance measures emphasized.   Jerilynn DELENA Buddle, MD

## 2023-10-01 NOTE — Addendum Note (Signed)
 Addended by: ZINA JERILYNN LABOR on: 10/01/2023 04:11 PM   Modules accepted: Orders

## 2023-10-08 ENCOUNTER — Ambulatory Visit: Payer: Self-pay | Admitting: Obstetrics and Gynecology

## 2023-10-08 LAB — SURGICAL PATHOLOGY

## 2023-10-13 ENCOUNTER — Ambulatory Visit: Admitting: Licensed Clinical Social Worker

## 2023-10-13 DIAGNOSIS — F418 Other specified anxiety disorders: Secondary | ICD-10-CM

## 2023-10-13 DIAGNOSIS — F1021 Alcohol dependence, in remission: Secondary | ICD-10-CM

## 2023-10-13 NOTE — BH Specialist Note (Addendum)
 Integrated Behavioral Health Follow Up In-Person Visit  MRN: [id number] Name: Tanya Goodman  Number of Integrated Behavioral Health Clinician visits: Additional Visit  Session Start time: 1330   Session End time: 1430  Total time in minutes: 60    Types of Service: Individual psychotherapy and General Behavioral Integrated Care (BHI)  Interpretor:No. Interpretor Name and Language: N/A  Subjective: [name] is a 38 year old female. Patient was referred by Primary Care Provider for ongoing behavioral health support. Patient reports continued sobriety as she approaches her one-year anniversary on 10/30/2023. She describes anxiety surrounding the idea of sharing her story publicly, despite feeling proud of her progress. She reports having demonstrated strength and resilience through several personal challenges in the past few months.  Duration of problem: Ongoing Severity of problem: Moderate  Objective: Mood: Euthymic Affect: Congruent Risk of harm to self or others: Denies suicidal or homicidal ideation  Family and Social: Patient maintains supportive relationships and is receiving encouragement regarding her recovery. School/Work: Patient remains employed and has been consistent with her responsibilities. Self-Care: Patient reports adequate self-care, including journaling, rest, and utilizing coping skills. Life Changes: Recently reflected on personal growth and the emotional journey tied to sobriety; no major life disruptions at this time.  Patient and/or Family's Strengths/Protective Factors: Sense of purpose  Goals Addressed: Patient will: Reduce symptoms of: Anxiety Increase knowledge and/or ability of: Emotional expression and communication Demonstrate ability to: Maintain sobriety and develop confidence sharing personal story Progress towards Goals: Ongoing  Interventions Utilized: Psychoeducation on anxiety and emotional regulation, journaling,  motivational interviewing Standardized Assessments Completed: N/A    Patient and/or Family Response: Patient was engaged and reflective. She responded positively to the journaling worksheet and expressed interest in preparing a speech, if she feels comfortable, for her one-year sobriety milestone.  Patient-Centered Plan: Patient is on the following Treatment Plan(s): Behavioral Health Maintenance Plan - Recovery Support and Anxiety Management  Clinical Assessment/Diagnosis: F10.21 Alcohol dependence in sustained full remission [facility])    Assessment: Patient is currently experiencing anticipatory anxiety related to sharing her sobriety story. She continues to demonstrate emotional growth, self-awareness, and sustained abstinence from alcohol. Patient may benefit from continued therapeutic support focused on anxiety reduction, self-expression, and celebration of recovery milestones.  Plan: Follow up with behavioral health clinician on: 10/30/2023 Behavioral recommendations: Continue journaling and emotional regulation strategies; consider sharing her story at her own pace Referrals: N/A at this time   Renda Bennett HEDWIG VADA, LCSW-A She/Her Behavioral Health Clinician [facility]  Internal Medicine Center Direct Dial:[phone]  Fax [phone] Main Office Phone: [phone]  [address], [place], [place] [address] Website: [facility] Internal Medicine Center  Primary Care  [place], [place]  [place]

## 2023-10-30 ENCOUNTER — Ambulatory Visit: Admitting: Licensed Clinical Social Worker

## 2023-10-30 DIAGNOSIS — F1021 Alcohol dependence, in remission: Secondary | ICD-10-CM

## 2023-10-30 DIAGNOSIS — F418 Other specified anxiety disorders: Secondary | ICD-10-CM

## 2023-10-30 NOTE — BH Specialist Note (Addendum)
 Integrated Behavioral Health Follow Up In-Person Visit  MRN: [id number] Name: Tanya Goodman  Number of Integrated Behavioral Health Clinician visits: Additional Visit  Session Start time: 1330   Session End time: 1430  Total time in minutes: 60    Types of Service: Individual psychotherapy and General Behavioral Integrated Care (BHI)  Interpretor:No. Interpretor Name and Language: N/A  Subjective: Tanya Goodman is a 38 y.o. female   Patient was referred by PCP for Counseling. Patient reports the following symptoms/concerns:  LCSW-A conducted session on today. Patient presented in an energetic and joyful mood. She shared positive reflections about spending quality time with her daughter over the past week and expressed excitement about recently celebrating her birthday. The session primarily focused on honoring and processing her one-year sobriety milestone, which she proudly celebrated today. Patient expressed deep gratitude for the support she has received throughout her recovery journey and was thankful to be in a space where she could reflect on her progress. She has shown tremendous growth emotionally, mentally, and in her overall stability. Her ability to remain consistent, self-aware, and motivated has been evident throughout her treatment. She appeared emotionally grounded, engaged, and optimistic about her future. No safety concerns were noted during the session  Duration of problem: One year; Severity of problem: mild  Objective: Mood: Happy and Affect: Appropriate Risk of harm to self or others: No plan to harm self or others  Life Context: Family and Social: Patient  has an extended family School/Work: Patient is employed Self-Care: Patient has went to [employer] and [employer] Life Changes: One year Sober  Patient and/or Family's Strengths/Protective Factors: Sense of purpose  Goals Addressed: Patient will:  Reduce symptoms of: In full remission of Alcohol  Abuse    Progress towards Goals: Ongoing  Interventions: Interventions utilized:  Supportive Counseling Standardized Assessments completed: Not Needed      Patient and/or Family Response: Patient reported counseling as life changing and beneficial.   Patient Centered Plan: Patient is on the following Treatment Plan(s): Continue AA meetings and bi weekly sessions with [facility].  Clinical Assessment/Diagnosis  Depression with anxiety    Assessment: Patient currently experiencing In full remission from Alcohol Abuse.   Patient may benefit from Ongoing AA meetings and individual therapy.  Plan: Follow up with behavioral health clinician on : 08/21; In -person; 10:15 am  Renda Pontes, MSW, LCSW-A She/Her Behavioral Health Clinician [facility]  Internal Medicine Center

## 2023-11-11 NOTE — Patient Instructions (Signed)
 SABRA

## 2023-11-20 ENCOUNTER — Ambulatory Visit: Admitting: Licensed Clinical Social Worker

## 2023-11-20 DIAGNOSIS — F418 Other specified anxiety disorders: Secondary | ICD-10-CM | POA: Diagnosis not present

## 2023-11-26 ENCOUNTER — Ambulatory Visit (INDEPENDENT_AMBULATORY_CARE_PROVIDER_SITE_OTHER): Admitting: Internal Medicine

## 2023-11-26 ENCOUNTER — Encounter: Payer: Self-pay | Admitting: Internal Medicine

## 2023-11-26 ENCOUNTER — Ambulatory Visit: Payer: Self-pay | Admitting: Internal Medicine

## 2023-11-26 ENCOUNTER — Other Ambulatory Visit (INDEPENDENT_AMBULATORY_CARE_PROVIDER_SITE_OTHER)

## 2023-11-26 VITALS — BP 90/60 | HR 74 | Ht 64.0 in | Wt 159.8 lb

## 2023-11-26 DIAGNOSIS — F1011 Alcohol abuse, in remission: Secondary | ICD-10-CM

## 2023-11-26 DIAGNOSIS — K760 Fatty (change of) liver, not elsewhere classified: Secondary | ICD-10-CM | POA: Diagnosis not present

## 2023-11-26 DIAGNOSIS — Z8719 Personal history of other diseases of the digestive system: Secondary | ICD-10-CM | POA: Diagnosis not present

## 2023-11-26 DIAGNOSIS — F1091 Alcohol use, unspecified, in remission: Secondary | ICD-10-CM

## 2023-11-26 LAB — COMPREHENSIVE METABOLIC PANEL WITH GFR
ALT: 12 U/L (ref 0–35)
AST: 12 U/L (ref 0–37)
Albumin: 4.7 g/dL (ref 3.5–5.2)
Alkaline Phosphatase: 51 U/L (ref 39–117)
BUN: 14 mg/dL (ref 6–23)
CO2: 24 meq/L (ref 19–32)
Calcium: 9 mg/dL (ref 8.4–10.5)
Chloride: 103 meq/L (ref 96–112)
Creatinine, Ser: 0.59 mg/dL (ref 0.40–1.20)
GFR: 115.42 mL/min (ref >60.00)
Glucose, Bld: 87 mg/dL (ref 70–99)
Potassium: 4.3 meq/L (ref 3.5–5.1)
Sodium: 135 meq/L (ref 135–145)
Total Bilirubin: 0.5 mg/dL (ref 0.2–1.2)
Total Protein: 7.7 g/dL (ref 6.0–8.3)

## 2023-11-26 LAB — CBC WITH DIFFERENTIAL/PLATELET
Basophils Absolute: 0.1 K/uL (ref 0.0–0.1)
Basophils Relative: 0.8 % (ref 0.0–3.0)
Eosinophils Absolute: 0.1 K/uL (ref 0.0–0.7)
Eosinophils Relative: 1.1 % (ref 0.0–5.0)
HCT: 41.6 % (ref 36.0–46.0)
Hemoglobin: 13.7 g/dL (ref 12.0–15.0)
Lymphocytes Relative: 30.9 % (ref 12.0–46.0)
Lymphs Abs: 3.1 K/uL (ref 0.7–4.0)
MCHC: 32.9 g/dL (ref 30.0–36.0)
MCV: 92.3 fl (ref 78.0–100.0)
Monocytes Absolute: 0.7 K/uL (ref 0.1–1.0)
Monocytes Relative: 7.1 % (ref 3.0–12.0)
Neutro Abs: 5.9 K/uL (ref 1.4–7.7)
Neutrophils Relative %: 60.1 % (ref 43.0–77.0)
Platelets: 302 K/uL (ref 150.0–400.0)
RBC: 4.51 Mil/uL (ref 3.87–5.11)
RDW: 13 % (ref 11.5–15.5)
WBC: 9.9 K/uL (ref 4.0–10.5)

## 2023-11-26 LAB — PROTIME-INR
INR: 1.2 ratio — ABNORMAL HIGH (ref 0.8–1.0)
Prothrombin Time: 13.1 s (ref 9.6–13.1)

## 2023-11-26 NOTE — Patient Instructions (Signed)
 Your provider has requested that you go to the basement level for lab work before leaving today. Press B on the elevator. The lab is located at the first door on the left as you exit the elevator.  Due to recent changes in healthcare laws, you may see the results of your imaging and laboratory studies on MyChart before your provider has had a chance to review them.  We understand that in some cases there may be results that are confusing or concerning to you. Not all laboratory results come back in the same time frame and the provider may be waiting for multiple results in order to interpret others.  Please give us  48 hours in order for your provider to thoroughly review all the results before contacting the office for clarification of your results.   You have been scheduled for an abdominal ultrasound at [facility]  on _____________ at ____________. Please arrive 30 minutes prior to your appointment for registration. Make certain not to have anything to eat or drink 6 hours prior to your appointment. Should you need to reschedule your appointment, please contact radiology at [phone]. This test typically takes about 30 minutes to perform.  I appreciate the opportunity to care for you. Lupita Commander, MD, [facility]

## 2023-11-26 NOTE — Progress Notes (Signed)
 Tanya Goodman 38 y.o. January 31, 1986 [id number]  Assessment & Plan:   Encounter Diagnoses  Name Primary?   History of alcoholic hepatitis Yes   Hepatic steatosis    Alcohol use disorder in remission       Continue abstinence from alcohol.  - Order CBC, CMET, and INR labs. - Schedule right upper quadrant ultrasound to assess liver status.      Further plans and follow-up pending results.  She is congratulated on abstinence.  Lab Results  Component Value Date   WBC 9.9 11/26/2023   HGB 13.7 11/26/2023   HCT 41.6 11/26/2023   MCV 92.3 11/26/2023   PLT 302.0 11/26/2023     Chemistry      Component Value Date/Time   NA 135 11/26/2023 1128   K 4.3 11/26/2023 1128   CL 103 11/26/2023 1128   CO2 24 11/26/2023 1128   BUN 14 11/26/2023 1128   CREATININE 0.59 11/26/2023 1128      Component Value Date/Time   CALCIUM  9.0 11/26/2023 1128   ALKPHOS 51 11/26/2023 1128   AST 12 11/26/2023 1128   ALT 12 11/26/2023 1128   BILITOT 0.5 11/26/2023 1128     Lab Results  Component Value Date   INR 1.2 (H) 11/26/2023   INR 1.2 (H) 04/30/2023   INR 1.3 (H) 01/23/2023     Subjective:   Chief Complaint: Follow-up with history of alcoholic hepatitis  HPI Discussed the use of AI scribe software for clinical note transcription with the patient, who gave verbal consent to proceed.   Tanya Goodman is a 38 year old female with alcoholic hepatitis and hepatic steatosis who presents for follow-up and imaging.  She was initially seen in 2024 in the hospital with severe alcoholic hepatitis and ascites.  She has been abstinent since that time.  She was seen in January of this year in our clinic by Vina Dasen, NP and was doing well and CMET CBC and INR were normal except for INR 1.2 which was trivially elevated.  She feels generally well with no significant health problems, although she mentions feeling tired, which she attributes to life circumstances. She has been abstinent from  alcohol and attends Zoom AA meetings for support, which she finds helpful.  Approximately three weeks ago, she experienced a 'weird feeling' in her right lower quadrant, which has since resolved. No current pain or discomfort in that area. Normal bowel habits.      No Known Allergies Current Meds  Medication Sig   hydrOXYzine  (VISTARIL ) 25 MG capsule Take 1 capsule (25 mg total) by mouth 3 (three) times daily as needed for anxiety.   Multiple Vitamin (MULTIVITAMIN WITH MINERALS) TABS tablet Take 1 tablet by mouth daily.   Past Medical History:  Diagnosis Date   Alcohol use disorder    Alcoholic hepatitis    SVD (spontaneous vaginal delivery)    x 3   Past Surgical History:  Procedure Laterality Date   IR PARACENTESIS  10/30/2022   NO PAST SURGERIES     Social History   Social History Narrative   She is single with 1 child   She works from home   Cigarette smoker, abstinent from alcohol, history of marijuana and benzodiazepine use but not now   family history includes Colon cancer in her maternal grandmother; Yvone' disease in her mother.   Review of Systems As per HPI  Objective:   Physical Exam @BP  90/60   Pulse 74   Ht 5'  4 (1.626 m)   Wt 159 lb 12.8 oz (72.5 kg)   LMP 11/04/2023 (Approximate)   BMI 27.43 kg/m @  General:  NAD Eyes:   anicteric Lungs:  clear Heart::  S1S2 no rubs, murmurs or gallops Abdomen:  soft and nontender, BS+ no HSM/mass Ext:   no edema, cyanosis or clubbing    Data Reviewed:  See HPI

## 2023-12-03 ENCOUNTER — Ambulatory Visit (HOSPITAL_BASED_OUTPATIENT_CLINIC_OR_DEPARTMENT_OTHER)
Admission: RE | Admit: 2023-12-03 | Discharge: 2023-12-03 | Disposition: A | Discharge status: Home / Self Care | Source: Ambulatory Visit | Attending: Internal Medicine | Admitting: Internal Medicine

## 2023-12-03 DIAGNOSIS — K7010 Alcoholic hepatitis without ascites: Secondary | ICD-10-CM | POA: Diagnosis not present

## 2023-12-03 DIAGNOSIS — Z8719 Personal history of other diseases of the digestive system: Secondary | ICD-10-CM | POA: Insufficient documentation

## 2023-12-03 DIAGNOSIS — K760 Fatty (change of) liver, not elsewhere classified: Secondary | ICD-10-CM | POA: Diagnosis not present

## 2023-12-08 ENCOUNTER — Ambulatory Visit: Admitting: Licensed Clinical Social Worker

## 2023-12-22 ENCOUNTER — Ambulatory Visit (INDEPENDENT_AMBULATORY_CARE_PROVIDER_SITE_OTHER): Admitting: Licensed Clinical Social Worker

## 2023-12-22 DIAGNOSIS — F418 Other specified anxiety disorders: Secondary | ICD-10-CM

## 2023-12-22 NOTE — BH Specialist Note (Signed)
 Integrated Behavioral Health Follow Up In-Person Visit  MRN: [id number] Name: Tanya Goodman  Number of Integrated Behavioral Health Clinician visits: Additional Visit  Session Start time: 1330   Session End time: 1430  Total time in minutes: 60    Types of Service: Individual psychotherapy and General Behavioral Integrated Care (BHI)  Interpretor:No. Interpretor Name and Language: N/A  Subjective: Tanya Goodman is a 38 y.o. female  Patient was referred by PCP for Counseling. Patient reports the following symptoms/concerns: Patient in office today presenting as calm and engaging. Session was spent discussing her adjusting to a life of being sober. Patient is adjusting to new family and new social dynamics. Patient is adjusting to changes in relationships. [facility] recommended patient continue exploring ideas and activities she likes. Patient discussed hiking and [facility] found a hiking group patient could participate in. We discussed self care and client attending a movie or dinner alone. Client continues to work and spend time with her daughter who is a major support system.  Duration of problem: More than a year; Severity of problem: mild  Objective: Mood: NA and Affect: Appropriate Risk of harm to self or others: No plan to harm self or others  Life Context: Family and Social: Patient has a daughter School/Work: Patient works for [employer] and [employer] Self-Care: Patient is exploring ideas Life Changes: Patient has been in alcohol recovery for over a year.  Patient and/or Family's Strengths/Protective Factors: Sense of purpose  Goals Addressed: Patient will:  Reduce symptoms of: anxiety and depression    Progress towards Goals: Ongoing  Interventions: Interventions utilized:  CBT Cognitive Behavioral Therapy Standardized Assessments completed: Not Needed      Patient and/or Family Response: Patient reports positive progress with counseling.  Patient Centered  Plan: Patient is on the following Treatment Plan(s): Bi-weekly counseling sessions  Clinical Assessment/Diagnosis  Depression with anxiety    Assessment: Patient currently experiencing Depression with Anxiety.   Patient may benefit from Ongoing OPT.  Plan: Follow up with behavioral health clinician on : Within the next two weeks.  Renda Pontes, MSW, LCSW-A She/Her Behavioral Health Clinician [facility]  Internal Medicine Center

## 2023-12-22 NOTE — Patient Instructions (Signed)
 Visit Information  Thank you for taking time to visit with me today. Please don't hesitate to contact me if I can be of assistance to you.    Inpatient Behavioral Health Contact Information: [facility] [address] [place], [place]  Phone: [phone]  [facility] [address] [place], [place] Phone: [phone] Fax: [phone]   Urgent Care/Facility Based Crisis Clinic Contact Information: [facility] [address] [place], [place] [address] Phone: [phone] Main Fax: [phone]  What is suicide? Committing suicide is the act of killing yourself.  A person who tries to kill himself has sadness and deep personal pain.  Suicide is the point where a person cannot handle the pain that they feel.  Suicide is their way of trying to end this pain.  People often act differently before they try to commit suicide.  It is important for you to watch for these warning signs.  Who is at risk? People of all ages are at risk.  Suicide is higher amongst teens, young adults, and the elderly.  Factors that put people at a higher risk include: A prior suicide attempt Having a psychiatric disorder, like depression or schizophrenia Alcohol or substance abuse A family history of psychiatric disorders, substance abuse or suicide Family violence including physical or sexual abuse Firearms in the home A serious medical illness, like cancer or chronic pain Personal crisis, such as loss of a loved one, bankruptcy, legal charges, etc.  What are the warning signs? Threatening or talking about wanting to hurt or kill oneself Looking for ways to kill oneself by getting access to pills, guns, etc. Talking about death and dying Feeling hopeless Giving away personal belongings Rage or uncontrolled anger Being reckless or engaging in reckless activity, i.e. drunk driving Feeling  trapped with no way out Increased alcohol or substance abuse Withdrawing from family and friends Dramatic mood swings Feeling there is no reason to live, no purpose in life  What to do Listen to what the person is saying.  Let them know you take them seriously. Be non-judgmental of the person's thoughts or feelings.  Allow the person to express feelings and be accepting of them. Don't be afraid to ask direct questions.  Ask, "Are you thinking of committing suicide?" "Are you thinking of ending your life?"  "Do you have a plan for hurting or killing yourself?"  "What would you use to kill yourself?" Stay with the person, don't leave the person alone if they say they want to commit suicide. Remove access to means, such as guns, pills, etc. Call for help immediately!  Who to Call Call 911    988 offers 24/7 judgement-free support for mental health, substance use and more. Text, call or chat 988.   [facility] at [phone]; [phone]  More Resources Suicide Awareness Voices of Education 774-063-1206 www.save.org  [employer] on Mental Illness(NAMI) (800) 950-NAMI www.nami.org  American Association of Suicidology 780-370-9995 www.suicidology.org  Important Names and Phone Numbers Psychiatrist:  Therapist:  Medical Doctor:  Pharmacy:   Renda Pontes, MSW, LCSW-A She/Her Behavioral Health Clinician [facility]  Internal Medicine Center Direct [phone]  Fax [phone] Main Office Phone: [phone] [address] [place] [place], [place] [address] Website: [facility] Internal Medicine [facility]  [place], [place]  [place]

## 2023-12-28 NOTE — BH Specialist Note (Signed)
 Integrated Behavioral Health Follow Up In-Person Visit  MRN: [id number] Name: Tanya Goodman  Number of Integrated Behavioral Health Clinician visits: Additional Visit  Session Start time: 1015   Session End time: 1115  Total time in minutes: 60    Types of Service: Individual psychotherapy and General Behavioral Integrated Care (BHI)  Interpretor:No. Interpretor Name and Language: N/A  Subjective: Tanya Goodman is a 38 y.o. female   Patient was referred by PCP for Counseling. Patient reports the following symptoms/concerns: Patient is in the early stages of adjusting to sobriety and new routines. Insight into her relationship with alcohol is improving. She shows strong motivation to maintain sobriety and build a healthier lifestyle. Main challenges include managing anxiety in social settings, fear of rejection when she is not drinking, and impatience with how slowly others are adjusting.  Protective factors include: personal motivation for health, emerging sober identity, willingness to engage in therapy, and a desire for stable relationships. Risk appears low at this time, with no current suicidal or homicidal ideation reported.   Patient will:  Practice daily self care routine that includes at least one activity for body, one for mind, and one for rest.  Use "pause and plan" when invited to events that involve alcohol.  Practice two prepared responses when offered a drink.  Journal at least three times this week about what she wants her sober life to look like.  Reach out to at least one sober or supportive person when urges or loneliness increase.  Next session will focus on:  Reviewing what worked and what was hard in social settings.  Exploring values around dating and choosing partners who respect her sobriety.  Building a written coping plan for high risk situations.   Objective: Mood: NA and Affect: Appropriate Risk of harm to self or others: No plan to  harm self or others    Patient and/or Family's Strengths/Protective Factors: Sense of purpose  Goals Addressed: Patient will:  Reduce symptoms of: anxiety and depression    Progress towards Goals: Ongoing  Interventions: Interventions utilized:  CBT Cognitive Behavioral Therapy Standardized Assessments completed: Patient declined screening     Clinical Assessment/Diagnosis  Depression with anxiety     Patient may benefit from Ongoing OPT.  Renda Pontes, MSW, LCSW-A She/Her Behavioral Health Clinician [facility]  Internal Medicine Center

## 2024-01-05 ENCOUNTER — Ambulatory Visit: Admitting: Licensed Clinical Social Worker

## 2024-01-05 DIAGNOSIS — F418 Other specified anxiety disorders: Secondary | ICD-10-CM | POA: Diagnosis present

## 2024-01-05 NOTE — BH Specialist Note (Signed)
 Integrated Behavioral Health Follow Up In-Person Visit  MRN: [id number] Name: Tanya Goodman  Number of Integrated Behavioral Health Clinician visits: Additional Visit  Session Start time: 1330   Session End time: 1430  Total time in minutes: 60    Types of Service: Individual psychotherapy and General Behavioral Integrated Care (BHI)  Interpretor:No. Interpretor Name and Language: N/A  Subjective: Tanya Goodman is a 38 y.o. female  Patient was referred by PCP for Counseling.  Patient reports the following symptoms/concerns: Patient was seen in the office today and presented as calm, open and engaging. Session was focused on her continued adjustment to life in sobriety and how this is shifting her family and social dynamics. She described both pride and some discomfort as she notices changes in relationships, including people who still use substances and those who are now beginning to trust her more. Patient shared that she has started to slowly explore dating and building new friendships, and we discussed how her values in sobriety are influencing the type of people she wants to connect with. She reports that she is doing well in her employment and feels more clear headed, reliable and confident at work. Coping skills reviewed included continuing to attend support meetings or therapy, using grounding and deep breathing when she feels overwhelmed, planning sober activities with safe people, setting boundaries with individuals who are not supportive of her sobriety, and using "urge surfing" and distraction when cravings or old thoughts arise. Areas to work on include pacing herself in new relationships, being honest with potential partners about her recovery when she feels ready, identifying triggers in family settings, practicing direct communication when she feels hurt or misunderstood, and maintaining a daily self care routine that supports her mental, emotional and physical health as  she continues to build a stable sober lifestyle.  Duration of problem: More than a year; Severity of problem: mild  Objective: Mood: NA and Affect: Appropriate Risk of harm to self or others: No plan to harm self or others  Life Context: Family and Social: Support includes daughter and mother School/Work: Employed Self-Care: Cooking ; Outings Life Changes: Sober  Patient and/or Family's Strengths/Protective Factors: Social connections  Goals Addressed: Patient will:  Reduce symptoms of: depression    Progress towards Goals: Ongoing  Interventions: Interventions utilized:  Motivational Interviewing and CBT Cognitive Behavioral Therapy Standardized Assessments completed: Patient declined screening      Patient and/or Family Response: Patient agreed to ongoing therapy  Patient Centered Plan: Patient is on the following Treatment Plan(s): Bi-Weekly  Clinical Assessment/Diagnosis  Depression with anxiety    Assessment: Patient currently experiencing Depression and Anxiety.   Patient may benefit from Ongoing Counseling.  Plan: Follow up with behavioral health clinician on : Within 14 days  Renda Pontes, MSW, LCSW-A She/Her Behavioral Health Clinician [facility]  Internal Medicine Center

## 2024-01-20 ENCOUNTER — Ambulatory Visit (INDEPENDENT_AMBULATORY_CARE_PROVIDER_SITE_OTHER): Admitting: Licensed Clinical Social Worker

## 2024-01-20 DIAGNOSIS — F418 Other specified anxiety disorders: Secondary | ICD-10-CM

## 2024-02-03 ENCOUNTER — Ambulatory Visit (INDEPENDENT_AMBULATORY_CARE_PROVIDER_SITE_OTHER): Admitting: Licensed Clinical Social Worker

## 2024-02-03 DIAGNOSIS — F418 Other specified anxiety disorders: Secondary | ICD-10-CM

## 2024-02-11 ENCOUNTER — Ambulatory Visit: Admitting: Licensed Clinical Social Worker

## 2024-02-11 DIAGNOSIS — F418 Other specified anxiety disorders: Secondary | ICD-10-CM | POA: Diagnosis not present

## 2024-02-12 NOTE — BH Specialist Note (Signed)
 Integrated Behavioral Health Follow Up In-Person Visit  MRN: [id number] Name: Tanya Goodman  Number of Integrated Behavioral Health Clinician visits: Additional Visit  Session Start time: 1330   Session End time: 1430  Total time in minutes: 60    Types of Service: Individual psychotherapy and General Behavioral Integrated Care (BHI)  Interpretor:No. Interpretor Name and Language: N/A  Subjective: Tanya Goodman is a 38 y.o. female  Patient was referred by PCP for Counseling.  Patient reports the following symptoms/concerns: Patient seen in office today, presenting calm, cooperative and engaged. Session focused on her ongoing adjustment to sobriety, including shifts in family roles, friendships and how she views herself. She reports maintaining sobriety, functioning well at work and beginning to explore dating and new connections while thinking more about what she wants her future to look like. We reviewed coping tools such as attending support groups, using grounding and deep breathing when stressed, choosing sober activities with safe people and setting limits with anyone who is not supportive of her recovery. Patient will continue to work on communication, pacing in relationships and a steady self care routine as she builds a stable life in sobriety.  Duration of problem: More than a year; Severity of problem: mild  Objective: Mood: NA and Affect: Appropriate Risk of harm to self or others: No plan to harm self or others    Patient and/or Family's Strengths/Protective Factors: Social connections  Goals Addressed: Patient will:  Reduce symptoms of: anxiety and depression    Progress towards Goals: Ongoing  Interventions: Interventions utilized:  CBT Cognitive Behavioral Therapy Standardized Assessments completed: Patient declined screening      Patient and/or Family Response: Patient agreed to ongoing sessions. Client reports benefiting from  counseling.  Patient Centered Plan: Patient is on the following Treatment Plan(s): Bi-Weekly Sessions  Clinical Assessment/Diagnosis  Depression with anxiety    Assessment: Patient currently experiencing Depression and Anxiety.   Patient may benefit from Ongoing sessions.  Plan: Follow up with behavioral health clinician on : Within 14 days  Renda Pontes, MSW, LCSW-A She/Her Behavioral Health Clinician [facility]  Internal Medicine Center

## 2024-02-12 NOTE — BH Specialist Note (Signed)
 Integrated Behavioral Health Follow Up In-Person Visit  MRN: [id number] Name: Tanya Goodman  Number of Integrated Behavioral Health Clinician visits: Additional Visit  Session Start time: 1330   Session End time: 1430  Total time in minutes: 60    Types of Service: Individual psychotherapy and General Behavioral Integrated Care (BHI)   Interpretor:No. Interpretor Name and Language: N/A   Subjective: Tanya Goodman is a 38 y.o. female  Patient was referred by PCP for Counseling.   Patient reports the following symptoms/concerns:    Treatment plan update for self-confidence and self-worth:  Patient will practice positive self-affirmations daily to reinforce a supportive self-image and recognize personal strengths.  Patient will set and achieve at least one realistic, meaningful goal per week (e.g., attending a new support meeting, exploring a new activity) and reflect on her progress with her therapist.  Patient will accept at least one compliment or positive feedback from others each week and document how it made her feel.  Patient will continue attending support groups to maintain a strong social network.  Patient will use journaling at least twice weekly to acknowledge achievements and identify moments of growth.  Duration of problem: More than a year; Severity of problem: mild   Objective: Mood: NA and Affect: Appropriate Risk of harm to self or others: No plan to harm self or others       Patient and/or Family's Strengths/Protective Factors: Social connections   Goals Addressed: Patient will:  Reduce symptoms of: anxiety and depression      Progress towards Goals: Ongoing   Interventions: Interventions utilized:  CBT Cognitive Behavioral Therapy Standardized Assessments completed: Patient declined screening           Patient and/or Family Response: Patient agreed to ongoing sessions. Client reports benefiting from counseling.   Patient Centered  Plan: Patient is on the following Treatment Plan(s): Bi-Weekly Sessions   Clinical Assessment/Diagnosis   Depression with anxiety      Assessment: Patient currently experiencing Depression and Anxiety.    Patient may benefit from Ongoing sessions.   Plan: Follow up with behavioral health clinician on : Patient will contact agency to schedule follow up.   Renda Pontes, MSW, LCSW-A She/Her Behavioral Health Clinician [facility]  Internal Medicine Center

## 2024-02-16 NOTE — Patient Instructions (Signed)
 Tanya Goodman

## 2024-02-17 NOTE — Patient Instructions (Signed)
 Tanya Goodman

## 2024-02-17 NOTE — BH Specialist Note (Unsigned)
 Integrated Behavioral Health Follow Up In-Person Visit  MRN: [id number] Name: Tanya Goodman  Number of Integrated Behavioral Health Clinician visits: Additional Visit  Session Start time: 1330   Session End time: 1430  Total time in minutes: 60    Types of Service: Individual psychotherapy and Health & Behavioral Assessment/Intervention  Interpretor:{yes [id number]} Interpretor Name and Language: ***  Subjective: Tanya Goodman is a 38 y.o. female accompanied by {Patient accompanied by:[phone]} Patient was referred by *** for ***. Patient reports the following symptoms/concerns: *** Duration of problem: ***; Severity of problem: {Mild/Moderate/Severe:20260}  Objective: Mood: {BHH MOOD:22306} and Affect: {BHH AFFECT:22307} Risk of harm to self or others: {CHL AMB BH Suicide Current Mental Status:21022748}  Life Context: Family and Social: *** School/Work: *** Self-Care: *** Life Changes: ***  Patient and/or Family's Strengths/Protective Factors: {CHL AMB BH PROTECTIVE FACTORS:[phone]}  Goals Addressed: Patient will:  Reduce symptoms of: {IBH Symptoms:21014056}   Increase knowledge and/or ability of: {IBH Patient Tools:21014057}   Demonstrate ability to: {IBH Goals:21014053}  Progress towards Goals: {CHL AMB BH PROGRESS TOWARDS GOALS:[phone]}  Interventions: Interventions utilized:  {IBH Interventions:21014054} Standardized Assessments completed: {IBH Screening Tools:21014051}      Patient and/or Family Response: ***  Patient Centered Plan: Patient is on the following Treatment Plan(s): ***  Clinical Assessment/Diagnosis  No diagnosis found.    Assessment: Patient currently experiencing ***.   Patient may benefit from ***.  Plan: Follow up with behavioral health clinician on : *** Behavioral recommendations: *** Referral(s): {IBH Referrals:21014055}  Renda Vella Pontes

## 2024-02-17 NOTE — Patient Instructions (Signed)
 SABRA

## 2024-02-19 NOTE — Patient Instructions (Signed)
 SABRA

## 2024-03-04 ENCOUNTER — Other Ambulatory Visit (HOSPITAL_COMMUNITY): Payer: Self-pay

## 2024-04-08 ENCOUNTER — Other Ambulatory Visit: Payer: Self-pay

## 2024-04-12 ENCOUNTER — Telehealth: Payer: Self-pay | Admitting: *Deleted

## 2024-04-12 ENCOUNTER — Telehealth: Payer: Self-pay | Admitting: Student

## 2024-04-12 NOTE — Telephone Encounter (Signed)
 Spoke with the patient. Appt has been sch   Name: Tanya Goodman, Tanya Goodman MRN: [id number]  Date: 04/15/2024 Status: Sch  Time: 2:00 PM Length: 60  Visit Type: INTEGRATED BH FOLLOW UP [1646] Copay: $0.00  Provider: Bennett Renda PARAS     Copied from CRM [id number]. Topic: Appointments - Appointment Scheduling >> Apr 12, 2024 10:35 AM Marda MATSU wrote: Please call pt Engdahl to set up an appointment with:  Bennett Renda PARAS Licensed Clinical Social Worker

## 2024-04-12 NOTE — Telephone Encounter (Signed)
 Call transferred to Triage from [place]; pt wanting to schedule an appt to see Renda and to see the doctor on the same day. I talked to the pt who stated right calf is slightly numb; she's unable to bend the ankle/foot upward; no pain when walking x 5 day. Appts schedule Thursday 1/15 to see Renda and Dr Kandis - instructed pt if her symptoms worsen, to call the office for a sooner appt.

## 2024-04-13 ENCOUNTER — Ambulatory Visit: Payer: Self-pay | Admitting: Licensed Clinical Social Worker

## 2024-04-15 ENCOUNTER — Ambulatory Visit: Admitting: Licensed Clinical Social Worker

## 2024-04-15 ENCOUNTER — Ambulatory Visit: Admitting: Student

## 2024-04-15 VITALS — BP 108/75 | HR 92 | Temp 97.8°F | Ht 64.0 in | Wt 147.8 lb

## 2024-04-15 DIAGNOSIS — K7010 Alcoholic hepatitis without ascites: Secondary | ICD-10-CM

## 2024-04-15 DIAGNOSIS — F411 Generalized anxiety disorder: Secondary | ICD-10-CM | POA: Diagnosis not present

## 2024-04-15 DIAGNOSIS — R42 Dizziness and giddiness: Secondary | ICD-10-CM

## 2024-04-15 DIAGNOSIS — G5791 Unspecified mononeuropathy of right lower limb: Secondary | ICD-10-CM | POA: Diagnosis not present

## 2024-04-15 MED ORDER — HYDROXYZINE PAMOATE 25 MG PO CAPS: 25.0000 mg | ORAL_CAPSULE | Freq: Three times a day (TID) | ORAL | 1 refills | Status: AC | PRN

## 2024-04-15 NOTE — Progress Notes (Signed)
 "  Established Patient Office Visit  Subjective   Patient ID: Tanya Goodman, female    DOB: 02-17-1986  Age: 39 y.o. MRN: [id number]  Chief Complaint  Patient presents with   Medication Refill   Leg Problem   Tanya Goodman is a 39 y.o. who presents to the clinic for postural dizziness and changes in right lower extremity dorsiflexion. Please see problem based assessment and plan for additional details.   Patient Active Problem List   Diagnosis Date Noted   Postural dizziness 04/15/2024   Mononeuropathy of right lower extremity 04/15/2024   ASCUS with positive high risk HPV cervical 09/18/2023   Episodic tension-type headache, not intractable 09/18/2023   Knee pain 03/11/2023   Depression with anxiety 11/18/2022   Tobacco use 11/18/2022   Health care maintenance 11/18/2022   Class 1 obesity 11/02/2022   Alcohol dependence in sustained full remission (HCC) 11/02/2022   Macrocytic anemia 10/30/2022   Alcoholic hepatitis (HCC) 10/29/2022     Objective:     BP 108/75 (BP Location: Right Arm, Cuff Size: Small)   Pulse 92   Temp 97.8 F (36.6 C) (Oral)   Ht 5' 4 (1.626 m)   Wt 147 lb 12.8 oz (67 kg)   SpO2 100%   BMI 25.37 kg/m  BP Readings from Last 3 Encounters:  04/15/24 108/75  11/26/23 90/60  10/01/23 119/78    Physical Exam Vitals reviewed.  Constitutional:      General: She is not in acute distress.    Appearance: She is not ill-appearing, toxic-appearing or diaphoretic.  Cardiovascular:     Rate and Rhythm: Normal rate and regular rhythm.  Pulmonary:     Effort: Pulmonary effort is normal.  Skin:    General: Skin is warm and dry.  Neurological:     Mental Status: She is alert.     Comments: She does have decreased strength in dorsiflexion and changes in her gait including increased flexion of the knee with clear ground with her right foot.   Psychiatric:        Mood and Affect: Mood and affect normal.     No results found for any visits on  04/15/24.  Last metabolic panel Lab Results  Component Value Date   GLUCOSE 87 11/26/2023   NA 135 11/26/2023   K 4.3 11/26/2023   CL 103 11/26/2023   CO2 24 11/26/2023   BUN 14 11/26/2023   CREATININE 0.59 11/26/2023   GFR 115.42 11/26/2023   CALCIUM  9.0 11/26/2023   PROT 7.7 11/26/2023   ALBUMIN 4.7 11/26/2023   BILITOT 0.5 11/26/2023   ALKPHOS 51 11/26/2023   AST 12 11/26/2023   ALT 12 11/26/2023   ANIONGAP 8 11/06/2022      The ASCVD Risk score (Arnett DK, et al., 2019) failed to calculate for the following reasons:   The 2019 ASCVD risk score is only valid for ages 38 to 54    Assessment & Plan:   Problem List Items Addressed This Visit       Digestive   Alcoholic hepatitis (HCC)   Relevant Medications   hydrOXYzine  (VISTARIL ) 25 MG capsule     Nervous and Auditory   Mononeuropathy of right lower extremity   Patient presents with a 1 week history of right common fibular nerve mononeuropathy.  She reports tingling and numbness along the dorsal surface of her right foot along with changes in dorsiflexion including weakness fatigue.  She reported that if she sits at home  she crosses her legs quite often.  There has been no other changes within the past week but could be contributing to the mononeuropathy present.  On physical exam, she does have decreased strength in dorsiflexion and changes in her gait including increased flexion of the knee with clear ground with her right foot.  Plan: -Encourage patient to avoid crossing legs to prevent compression -Encourage patient to use a pillow to place behind her knee as she sits at a desk for work -Patient was instructed to notify clinic if her symptoms do not improve over the next 2-4 weeks and we can refer to physical therapy      Relevant Medications   hydrOXYzine  (VISTARIL ) 25 MG capsule     Other   Postural dizziness - Primary   Patient reports a week of postural dizziness when changing positions that started and  resolved last week.  On physical exam today, blood pressure is 115/68. Orthostatics were not positive with readings of 123/71, 110/73, and 108/75.  He does not take any blood pressure medications at this moment.  Plan: - Encourage patient to continue hydration and purchase compression stockings -Instructed patient to change positions slowly to prevent falls      Other Visit Diagnoses       Anxiety state       Relevant Medications   hydrOXYzine  (VISTARIL ) 25 MG capsule       Return if symptoms worsen or fail to improve.    Damien Lease, DO  "

## 2024-04-15 NOTE — Assessment & Plan Note (Signed)
 Patient presents with a 1 week history of right common fibular nerve mononeuropathy.  She reports tingling and numbness along the dorsal surface of her right foot along with changes in dorsiflexion including weakness fatigue.  She reported that if she sits at home she crosses her legs quite often.  There has been no other changes within the past week but could be contributing to the mononeuropathy present.  On physical exam, she does have decreased strength in dorsiflexion and changes in her gait including increased flexion of the knee with clear ground with her right foot.  Plan: -Encourage patient to avoid crossing legs to prevent compression -Encourage patient to use a pillow to place behind her knee as she sits at a desk for work -Patient was instructed to notify clinic if her symptoms do not improve over the next 2-4 weeks and we can refer to physical therapy

## 2024-04-15 NOTE — Assessment & Plan Note (Signed)
 Patient reports a week of postural dizziness when changing positions that started and resolved last week.  On physical exam today, blood pressure is 115/68. Orthostatics were not positive with readings of 123/71, 110/73, and 108/75.  He does not take any blood pressure medications at this moment.  Plan: - Encourage patient to continue hydration and purchase compression stockings -Instructed patient to change positions slowly to prevent falls

## 2024-04-15 NOTE — Patient Instructions (Signed)
 Thank you, [name] [place] for allowing us  to provide your care today. Today we discussed dizziness when changing positions and right leg weakness and numbness.  For the dizziness: Be sure to drink plenty of water and wear compression stockings and stand up slowly.  For the right leg weakness. You have common fibular nerve compression. Stop crossing your legs and try to use a pillow to rest behind your knee when you are sitting down. If your symptoms do now improve or worsen over the next 2 weeks please call and let us  know. We will be able to set up PT and possibly a brace for you.    Follow up: 1 year or sooner if needed   Remember:   Should you have any questions or concerns please call the internal medicine clinic at [phone].     Please note that our late policy has changed.  If you are more than 15 minutes late to your appointment, you may be asked to reschedule your appointment.  Dr. Kandis, D.O. [facility]

## 2024-04-15 NOTE — Progress Notes (Signed)
 Internal Medicine Clinic Attending  Case discussed with the resident at the time of the visit.  We reviewed the resident's history and exam and pertinent patient test results.  I agree with the assessment, diagnosis, and plan of care documented in the resident's note.

## 2024-05-03 ENCOUNTER — Ambulatory Visit: Payer: Self-pay | Admitting: Licensed Clinical Social Worker

## 2024-05-17 ENCOUNTER — Ambulatory Visit: Payer: Self-pay | Admitting: Licensed Clinical Social Worker
